# Patient Record
Sex: Female | Born: 1960
Health system: Southern US, Community
[De-identification: ages and names within clinical notes are randomized; demographics above are authoritative.]

## PROBLEM LIST (undated history)

## (undated) DIAGNOSIS — D649 Anemia, unspecified: Secondary | ICD-10-CM

## (undated) DIAGNOSIS — Z8719 Personal history of other diseases of the digestive system: Secondary | ICD-10-CM

## (undated) DIAGNOSIS — M949 Disorder of cartilage, unspecified: Secondary | ICD-10-CM

## (undated) DIAGNOSIS — M5430 Sciatica, unspecified side: Secondary | ICD-10-CM

## (undated) DIAGNOSIS — M899 Disorder of bone, unspecified: Secondary | ICD-10-CM

## (undated) DIAGNOSIS — I10 Essential (primary) hypertension: Secondary | ICD-10-CM

## (undated) DIAGNOSIS — N951 Menopausal and female climacteric states: Secondary | ICD-10-CM

## (undated) DIAGNOSIS — IMO0002 Reserved for concepts with insufficient information to code with codable children: Secondary | ICD-10-CM

## (undated) DIAGNOSIS — G43709 Chronic migraine without aura, not intractable, without status migrainosus: Secondary | ICD-10-CM

## (undated) DIAGNOSIS — B029 Zoster without complications: Secondary | ICD-10-CM

## (undated) HISTORY — DX: Menopausal and female climacteric states: N95.1

## (undated) HISTORY — DX: Chronic migraine without aura, not intractable, without status migrainosus: G43.709

## (undated) HISTORY — DX: Reserved for concepts with insufficient information to code with codable children: IMO0002

## (undated) HISTORY — DX: Anemia, unspecified: D64.9

## (undated) HISTORY — DX: Sciatica, unspecified side: M54.30

## (undated) HISTORY — DX: Personal history of other diseases of the digestive system: Z87.19

## (undated) HISTORY — DX: Zoster without complications: B02.9

## (undated) HISTORY — DX: Disorder of bone, unspecified: M89.9

## (undated) HISTORY — DX: Essential (primary) hypertension: I10

## (undated) HISTORY — DX: Disorder of cartilage, unspecified: M94.9

---

## 1995-10-05 DIAGNOSIS — B029 Zoster without complications: Secondary | ICD-10-CM

## 1995-10-05 HISTORY — DX: Zoster without complications: B02.9

## 2002-02-13 ENCOUNTER — Other Ambulatory Visit: Admission: RE | Admit: 2002-02-13 | Discharge: 2002-02-13 | Payer: Self-pay | Admitting: Internal Medicine

## 2003-12-30 ENCOUNTER — Other Ambulatory Visit: Admission: RE | Admit: 2003-12-30 | Discharge: 2003-12-30 | Payer: Self-pay | Admitting: Obstetrics and Gynecology

## 2004-10-04 HISTORY — PX: ABDOMINAL HYSTERECTOMY: SHX81

## 2004-10-04 HISTORY — PX: OTHER SURGICAL HISTORY: SHX169

## 2004-10-04 LAB — HM MAMMOGRAPHY: HM Mammogram: NORMAL

## 2005-01-20 ENCOUNTER — Inpatient Hospital Stay (HOSPITAL_COMMUNITY): Admission: RE | Admit: 2005-01-20 | Discharge: 2005-01-22 | Payer: Self-pay | Admitting: Obstetrics and Gynecology

## 2005-01-20 ENCOUNTER — Encounter (INDEPENDENT_AMBULATORY_CARE_PROVIDER_SITE_OTHER): Payer: Self-pay | Admitting: Specialist

## 2005-06-23 ENCOUNTER — Ambulatory Visit (HOSPITAL_COMMUNITY): Admission: RE | Admit: 2005-06-23 | Discharge: 2005-06-23 | Payer: Self-pay | Admitting: Internal Medicine

## 2005-07-08 ENCOUNTER — Encounter: Admission: RE | Admit: 2005-07-08 | Discharge: 2005-10-06 | Payer: Self-pay | Admitting: Internal Medicine

## 2005-10-15 ENCOUNTER — Encounter: Admission: RE | Admit: 2005-10-15 | Discharge: 2006-01-13 | Payer: Self-pay | Admitting: Internal Medicine

## 2006-09-26 ENCOUNTER — Emergency Department (HOSPITAL_COMMUNITY): Admission: EM | Admit: 2006-09-26 | Discharge: 2006-09-26 | Payer: Self-pay | Admitting: Emergency Medicine

## 2006-09-27 ENCOUNTER — Emergency Department (HOSPITAL_COMMUNITY): Admission: EM | Admit: 2006-09-27 | Discharge: 2006-09-27 | Payer: Self-pay | Admitting: Emergency Medicine

## 2006-10-04 HISTORY — PX: APPENDECTOMY: SHX54

## 2006-10-14 ENCOUNTER — Inpatient Hospital Stay (HOSPITAL_COMMUNITY): Admission: EM | Admit: 2006-10-14 | Discharge: 2006-10-20 | Payer: Self-pay | Admitting: Emergency Medicine

## 2006-10-14 ENCOUNTER — Encounter (INDEPENDENT_AMBULATORY_CARE_PROVIDER_SITE_OTHER): Payer: Self-pay | Admitting: Specialist

## 2006-10-22 ENCOUNTER — Emergency Department (HOSPITAL_COMMUNITY): Admission: EM | Admit: 2006-10-22 | Discharge: 2006-10-22 | Payer: Self-pay | Admitting: Emergency Medicine

## 2008-09-20 ENCOUNTER — Emergency Department (HOSPITAL_COMMUNITY): Admission: EM | Admit: 2008-09-20 | Discharge: 2008-09-20 | Payer: Self-pay | Admitting: Emergency Medicine

## 2008-10-11 ENCOUNTER — Encounter: Admission: RE | Admit: 2008-10-11 | Discharge: 2009-01-09 | Payer: Self-pay | Admitting: Neurosurgery

## 2008-11-12 ENCOUNTER — Encounter: Admission: RE | Admit: 2008-11-12 | Discharge: 2008-11-12 | Payer: Self-pay | Admitting: Neurosurgery

## 2009-01-10 ENCOUNTER — Ambulatory Visit: Payer: Self-pay | Admitting: Internal Medicine

## 2009-01-10 DIAGNOSIS — I10 Essential (primary) hypertension: Secondary | ICD-10-CM | POA: Insufficient documentation

## 2009-01-10 DIAGNOSIS — G43909 Migraine, unspecified, not intractable, without status migrainosus: Secondary | ICD-10-CM | POA: Insufficient documentation

## 2009-01-10 DIAGNOSIS — M8580 Other specified disorders of bone density and structure, unspecified site: Secondary | ICD-10-CM | POA: Insufficient documentation

## 2009-01-10 DIAGNOSIS — D649 Anemia, unspecified: Secondary | ICD-10-CM | POA: Insufficient documentation

## 2009-01-10 DIAGNOSIS — M5430 Sciatica, unspecified side: Secondary | ICD-10-CM | POA: Insufficient documentation

## 2009-01-10 DIAGNOSIS — Z8719 Personal history of other diseases of the digestive system: Secondary | ICD-10-CM | POA: Insufficient documentation

## 2009-01-13 ENCOUNTER — Encounter: Payer: Self-pay | Admitting: Internal Medicine

## 2009-01-13 LAB — CONVERTED CEMR LAB
ALT: 32 units/L (ref 0–35)
AST: 26 units/L (ref 0–37)
BUN: 12 mg/dL (ref 6–23)
Basophils Absolute: 0.1 10*3/uL (ref 0.0–0.1)
Bilirubin Urine: NEGATIVE
Bilirubin, Direct: 0.1 mg/dL (ref 0.0–0.3)
Cholesterol: 224 mg/dL — ABNORMAL HIGH (ref 0–200)
Creatinine, Ser: 0.7 mg/dL (ref 0.4–1.2)
Direct LDL: 151.6 mg/dL
Eosinophils Relative: 1.7 % (ref 0.0–5.0)
GFR calc non Af Amer: 94.11 mL/min (ref >60)
Glucose, Bld: 94 mg/dL (ref 70–99)
Ketones, ur: NEGATIVE mg/dL
Leukocytes, UA: NEGATIVE
Lymphs Abs: 1.8 10*3/uL (ref 0.7–4.0)
Monocytes Absolute: 0.7 10*3/uL (ref 0.1–1.0)
Monocytes Relative: 10 % (ref 3.0–12.0)
Neutrophils Relative %: 63.2 % (ref 43.0–77.0)
Platelets: 235 10*3/uL (ref 150.0–400.0)
RDW: 12.4 % (ref 11.5–14.6)
Specific Gravity, Urine: 1.02 (ref 1.000–1.030)
TSH: 0.7 microintl units/mL (ref 0.35–5.50)
Total Bilirubin: 0.7 mg/dL (ref 0.3–1.2)
Total CHOL/HDL Ratio: 4
Triglycerides: 58 mg/dL (ref 0.0–149.0)
Urine Glucose: NEGATIVE mg/dL
Urobilinogen, UA: 0.2 (ref 0.0–1.0)
VLDL: 11.6 mg/dL (ref 0.0–40.0)
Vit D, 25-Hydroxy: 12 ng/mL — ABNORMAL LOW (ref 30–89)
WBC: 7.2 10*3/uL (ref 4.5–10.5)

## 2009-02-01 HISTORY — PX: OTHER SURGICAL HISTORY: SHX169

## 2009-02-04 ENCOUNTER — Ambulatory Visit (HOSPITAL_COMMUNITY): Admission: RE | Admit: 2009-02-04 | Discharge: 2009-02-05 | Payer: Self-pay | Admitting: Neurosurgery

## 2009-10-08 ENCOUNTER — Telehealth: Payer: Self-pay | Admitting: Internal Medicine

## 2010-03-03 ENCOUNTER — Telehealth: Payer: Self-pay | Admitting: Internal Medicine

## 2010-03-27 ENCOUNTER — Ambulatory Visit: Payer: Self-pay | Admitting: Internal Medicine

## 2010-03-27 DIAGNOSIS — N951 Menopausal and female climacteric states: Secondary | ICD-10-CM | POA: Insufficient documentation

## 2010-03-30 LAB — CONVERTED CEMR LAB
AST: 36 units/L (ref 0–37)
BUN: 10 mg/dL (ref 6–23)
Basophils Absolute: 0 10*3/uL (ref 0.0–0.1)
Bilirubin, Direct: 0.1 mg/dL (ref 0.0–0.3)
Calcium: 8.9 mg/dL (ref 8.4–10.5)
Cholesterol: 189 mg/dL (ref 0–200)
Creatinine, Ser: 0.6 mg/dL (ref 0.4–1.2)
GFR calc non Af Amer: 114.09 mL/min (ref >60)
Glucose, Bld: 113 mg/dL — ABNORMAL HIGH (ref 70–99)
HCT: 37.4 % (ref 36.0–46.0)
HDL: 44.6 mg/dL (ref >39.00)
Ketones, ur: NEGATIVE mg/dL
Leukocytes, UA: NEGATIVE
Lymphs Abs: 2 10*3/uL (ref 0.7–4.0)
Monocytes Relative: 7.2 % (ref 3.0–12.0)
Neutrophils Relative %: 68.7 % (ref 43.0–77.0)
Nitrite: NEGATIVE
Platelets: 246 10*3/uL (ref 150.0–400.0)
Potassium: 3.8 meq/L (ref 3.5–5.1)
RDW: 12.7 % (ref 11.5–14.6)
Specific Gravity, Urine: 1.02 (ref 1.000–1.030)
TSH: 0.68 microintl units/mL (ref 0.35–5.50)
Total Bilirubin: 0.5 mg/dL (ref 0.3–1.2)
Total Protein, Urine: NEGATIVE mg/dL
Triglycerides: 210 mg/dL — ABNORMAL HIGH (ref 0.0–149.0)
VLDL: 42 mg/dL — ABNORMAL HIGH (ref 0.0–40.0)
WBC: 9 10*3/uL (ref 4.5–10.5)
pH: 6 (ref 5.0–8.0)

## 2010-07-21 ENCOUNTER — Emergency Department (HOSPITAL_COMMUNITY): Admission: EM | Admit: 2010-07-21 | Discharge: 2010-07-21 | Payer: Self-pay | Admitting: Emergency Medicine

## 2010-08-03 ENCOUNTER — Encounter (INDEPENDENT_AMBULATORY_CARE_PROVIDER_SITE_OTHER): Payer: Self-pay | Admitting: *Deleted

## 2010-08-05 ENCOUNTER — Encounter
Admission: RE | Admit: 2010-08-05 | Discharge: 2010-09-10 | Payer: Self-pay | Source: Home / Self Care | Attending: Neurosurgery | Admitting: Neurosurgery

## 2010-09-03 HISTORY — PX: LUMBAR FUSION: SHX111

## 2010-09-11 ENCOUNTER — Inpatient Hospital Stay (HOSPITAL_COMMUNITY)
Admission: RE | Admit: 2010-09-11 | Discharge: 2010-09-14 | Payer: Self-pay | Source: Home / Self Care | Attending: Neurosurgery | Admitting: Neurosurgery

## 2010-11-03 NOTE — Progress Notes (Signed)
Summary: Rx refill req  Phone Note Call from Patient Call back at Home Phone [phone]   Caller: Patient [phone] Summary of Call: pt called requesting 1 month supply of Lopressor until next appt with VAL, 06/24. Rx done, pt informed Initial call taken by: Margaret Pyle, CMA,  Mar 03, 2010 11:06 AM    Prescriptions: LOPRESSOR 50 MG TABS (METOPROLOL TARTRATE) take 1/2 by mouth two times a day  #30.0 Each x 0   Entered by:   Margaret Pyle, CMA   Authorized by:   Newt Lukes MD   Signed by:   Margaret Pyle, CMA on 03/03/2010   Method used:   Electronically to        [facility] [address] [phone]* (retail)       [address]       [address]       [place], [place]  [address]       Ph: [phone]       Fax: [phone]   RxID:   [id number]

## 2010-11-03 NOTE — Letter (Signed)
Summary: [facility] Referral (unable to schedule) Notification  [facility]  [address] [place], [place] [address]   Phone: [phone]  Fax: [phone]      August 03, 2010 [name] 1961-06-12 MRN: [id number]   [name] [address] [place], [place]  [address]   Dear Dr. Felicity Coyer:   Thank you for your kind referral of the above patient. We have attempted to schedule the recommended Colonoscopy but have been unable to schedule because:  _x_ The patient was not available by phone and/or has not returned our calls.  __ The patient declined to schedule the procedure at this time.  We appreciate the referral and hope that we will have the opportunity to treat this patient in the future.    Sincerely,   [facility]  Vania Rea. Jarold Motto M.D. Hedwig Morton. Juanda Chance M.D. Venita Lick. Russella Dar M.D. Wilhemina Bonito. Marina Goodell M.D. Barbette Hair. Arlyce Dice M.D. Iva Boop M.D. Cheron Every.D.

## 2010-11-03 NOTE — Progress Notes (Signed)
Summary: rx request  Phone Note Call from Patient Call back at Home Phone [phone]   Summary of Call: Spoke with patient and she needs her prescription's for Lopressor, HCTZ, and Maxalt sent to [facility] on [address] (due to new insurance). Ok to proceed? Initial call taken by: Lucious Groves,  October 08, 2009 10:19 AM  Follow-up for Phone Call        yes - ok to do so - thanks Follow-up by: Newt Lukes MD,  October 08, 2009 11:56 AM  Additional Follow-up for Phone Call Additional follow up Details #1::        Please advise on quantity of Maxalt, thanks Additional Follow-up by: Margaret Pyle, CMA,  October 08, 2009 12:12 PM    Additional Follow-up for Phone Call Additional follow up Details #2::    1 box (contains 12 doses), refill 1 Newt Lukes MD  October 08, 2009 12:48 PM   Prescriptions: MAXALT 10 MG TABS (RIZATRIPTAN BENZOATE) take as needed for migraine  #1 box x 1   Entered by:   Margaret Pyle, CMA   Authorized by:   Newt Lukes MD   Signed by:   Margaret Pyle, CMA on 10/08/2009   Method used:   Electronically to        [facility] [address] [phone]* (retail)       [address]       [address]       [place], [place]  [address]       Ph: [phone]       Fax: [phone]   RxID:   [id number] LOPRESSOR 50 MG TABS (METOPROLOL TARTRATE) take 1/2 by mouth two times a day  #30 x 2   Entered by:   Margaret Pyle, CMA   Authorized by:   Newt Lukes MD   Signed by:   Margaret Pyle, CMA on 10/08/2009   Method used:   Electronically to        [facility] [address] [phone]* (retail)       [address]       [address]       [place], [place]  [address]       Ph: [phone]       Fax: [phone]   RxID:   [id number] HYDROCHLOROTHIAZIDE 12.5 MG TABS (HYDROCHLOROTHIAZIDE) take 1 by mouth qd  #30 x 4   Entered by:   Margaret Pyle, CMA   Authorized by:   Newt Lukes MD   Signed by:   Margaret Pyle, CMA on 10/08/2009   Method used:   Electronically to        [facility] [address] [phone]* (retail)       [address]       [address]       [place], [place]  [address]       Ph: [phone]       Fax: [phone]   RxID:   [id number]

## 2010-11-03 NOTE — Assessment & Plan Note (Signed)
Summary: FU---BP MED---STC   Vital Signs:  Patient profile:   50 year old female Height:      5.3 inches (13.46 cm) Weight:      133.12 pounds (60.51 kg) BMI:     3343.96 O2 Sat:      97 % on Room air Temp:     98.0 degrees F (36.67 degrees C) oral Pulse rate:   82 / minute BP sitting:   120 / 78  (left arm) Cuff size:   regular  Vitals Entered By: Orlan Leavens (March 27, 2010 1:06 PM)  O2 Flow:  Room air CC: F/u on BP MEDS Comments Requesting 90 day on meds send to [facility]   Primary Care Provider:  Newt Lukes MD  CC:  F/u on BP MEDS.  History of Present Illness: patient is here today for annual physical. Patient feels well and has no complaints.   Preventive Screening-Counseling & Management  Alcohol-Tobacco     Alcohol drinks/day: <1     Smoking Status: never     Tobacco Counseling: not indicated; no tobacco use  Caffeine-Diet-Exercise     Exercise Counseling: to improve exercise regimen     Depression Counseling: not indicated; screening negative for depression  Safety-Violence-Falls     Seat Belt Counseling: not indicated; patient wears seat belts     Helmet Counseling: not indicated; patient wears helmet when riding bicycle/motocycle     Firearms in the Home: no firearms in the home     Firearm Counseling: not applicable     Violence in the Home: no risk noted     Fall Risk Counseling: not indicated; no significant falls noted  Clinical Review Panels:  Prevention   Last Mammogram:  normal (10/04/2004)   Last Pap Smear:  normal (10/04/2004)  Immunizations   Last Tetanus Booster:  Historical (10/04/2001)  Lipid Management   Cholesterol:  224 (01/10/2009)   HDL (good cholesterol):  52.30 (01/10/2009)  CBC   WBC:  7.2 (01/10/2009)   RBC:  4.32 (01/10/2009)   Hgb:  14.0 (01/10/2009)   Hct:  40.7 (01/10/2009)   Platelets:  235.0 (01/10/2009)   MCV  94.3 (01/10/2009)   MCHC  34.4 (01/10/2009)   RDW  12.4 (01/10/2009)   PMN:  63.2  (01/10/2009)   Lymphs:  24.4 (01/10/2009)   Monos:  10.0 (01/10/2009)   Eosinophils:  1.7 (01/10/2009)   Basophil:  0.7 (01/10/2009)  Complete Metabolic Panel   Glucose:  94 (01/10/2009)   Sodium:  142 (01/10/2009)   Potassium:  4.3 (01/10/2009)   Chloride:  107 (01/10/2009)   CO2:  30 (01/10/2009)   BUN:  12 (01/10/2009)   Creatinine:  0.7 (01/10/2009)   Albumin:  3.8 (01/10/2009)   Total Protein:  7.0 (01/10/2009)   Calcium:  9.1 (01/10/2009)   Total Bili:  0.7 (01/10/2009)   Alk Phos:  68 (01/10/2009)   SGPT (ALT):  32 (01/10/2009)   SGOT (AST):  26 (01/10/2009)   Current Medications (verified): 1)  Hydrochlorothiazide 12.5 Mg Tabs (Hydrochlorothiazide) .... Take 1 By Mouth Qd 2)  Lopressor 50 Mg Tabs (Metoprolol Tartrate) .... Take 1/2 By Mouth Two Times A Day 3)  Maxalt 10 Mg Tabs (Rizatriptan Benzoate) .... Take As Needed For Migraine 4)  Ibuprofen 600 Mg Tabs (Ibuprofen) .... Take 1 Q 6 Hours Prn 5)  Vitamin D3 1000 Unit Tabs (Cholecalciferol) .Marland Kitchen.. 1 By Mouth Once Daily 6)  Excedrin Migraine 250-250-65 Mg Tabs (Aspirin-Acetaminophen-Caffeine) .... Use As Needed  Allergies (verified):  No Known Drug Allergies  Past History:  Past Medical History: Anemia-NOS Diverticulitis, hx of Hypertension Osteopenia migraines  MD roster: nsurg - roy  Past Surgical History: Appendectomy (2007) Hysterectomy (2006) Bladder Tact (2006) right L5-S1 laminectomy (02/2009)  Family History: Reviewed history from 01/10/2009 and no changes required. Family History of Arthritis (grandparents) Family History Diabetes 1st degree relative (grandparents) Family History High cholesterol (parents) Family hx hypothyroid (parents & grandparent) Family History of CAD Female 1st degree relative <60 (parent) Family History Hypertension (parents)  Social History: Marital Status: Married Children:  Occupation: Teacher, early years/pre, Associate Professor  Review of Systems       see HPI  above. I have reviewed all other systems and they were negative.   Physical Exam  General:  alert, well-developed, well-nourished, and cooperative to examination.    Eyes:  vision grossly intact; pupils equal, round and reactive to light.  conjunctiva and lids normal.    Ears:  normal pinnae bilaterally, without erythema, swelling, or tenderness to palpation. TMs clear, without effusion, or cerumen impaction. Hearing grossly normal bilaterally  Mouth:  teeth and gums in good repair; mucous membranes moist, without lesions or ulcers. oropharynx clear without exudate, no erythema.  Lungs:  normal respiratory effort, no intercostal retractions or use of accessory muscles; normal breath sounds bilaterally - no crackles and no wheezes.    Heart:  normal rate, regular rhythm, no murmur, and no rub. BLE without edema. normal DP pulses and normal cap refill in all 4 extremities    Abdomen:  soft, non-tender, normal bowel sounds, no distention; no masses and no appreciable hepatomegaly or splenomegaly.   Msk:  No deformity or scoliosis noted of thoracic or lumbar spine.   Neurologic:  alert & oriented X3 and cranial nerves II-XII symetrically intact.  strength normal in all extremities, sensation intact to light touch, and gait normal. speech fluent without dysarthria or aphasia; follows commands with good comprehension.  Skin:  no rashes, vesicles, ulcers, or erythema. No nodules or irregularity to palpation.  Psych:  Oriented X3, memory intact for recent and remote, normally interactive, good eye contact, not anxious appearing, not depressed appearing, and not agitated.      Impression & Recommendations:  Problem # 1:  PREVENTIVE HEALTH CARE (ICD-V70.0)  Patient has been counseled on age-appropriate routine health concerns for screening and prevention. These are reviewed and up-to-date. Immunizations are up-to-date or declined. Labs today and ECG reviewed. refer for mammo and colo  now  Orders: TLB-Lipid Panel (80061-LIPID) TLB-BMP (Basic Metabolic Panel-BMET) (80048-METABOL) TLB-CBC Platelet - w/Differential (85025-CBCD) TLB-Hepatic/Liver Function Pnl (80076-HEPATIC) TLB-TSH (Thyroid Stimulating Hormone) (84443-TSH) TLB-Udip w/ Micro (81001-URINE) T-Vitamin D (25-Hydroxy) (62831-51761) EKG w/ Interpretation (93000) Gastroenterology Referral (GI) Misc. Referral (Misc. Ref)  Problem # 2:  MENOPAUSAL SYNDROME (ICD-627.2) s/p hysterectomy so no menses to judge - check FSH Orders: TLB-FSH (Follicle Stimulating Hormone) (83001-FSH)  Problem # 3:  OSTEOPENIA (ICD-733.90)  Orders: T-Vitamin D (25-Hydroxy) (60737-10626)  Discussed medication use, applications of heat or ice, and exercises.   Problem # 4:  SCIATICA, LEFT (ICD-724.3) resolved s/p decompression 02/2009 - surg note reviewed The following medications were removed from the medication list:    Ultram 50 Mg Tabs (Tramadol hcl) .Marland Kitchen... Take 1-2 q 6 hours prn    Robaxin 500 Mg Tabs (Methocarbamol) .Marland Kitchen... Take 1 q 4-6 hours prn    Hydrocodone-acetaminophen 5-325 Mg Tabs (Hydrocodone-acetaminophen) .Marland Kitchen... Take as needed for back pain Her updated medication list for this problem includes:  Ibuprofen 600 Mg Tabs (Ibuprofen) .Marland Kitchen... Take 1 q 6 hours prn    Excedrin Migraine 250-250-65 Mg Tabs (Aspirin-acetaminophen-caffeine) ..... Use as needed  Complete Medication List: 1)  Hydrochlorothiazide 12.5 Mg Tabs (Hydrochlorothiazide) .... Take 1 by mouth qd 2)  Maxalt 10 Mg Tabs (Rizatriptan benzoate) .... Take as needed for migraine 3)  Ibuprofen 600 Mg Tabs (Ibuprofen) .... Take 1 q 6 hours prn 4)  Vitamin D3 1000 Unit Tabs (Cholecalciferol) .Marland Kitchen.. 1 by mouth once daily 5)  Excedrin Migraine 250-250-65 Mg Tabs (Aspirin-acetaminophen-caffeine) .... Use as needed 6)  Metoprolol Tartrate 25 Mg Tabs (Metoprolol tartrate) .... Take 1 by mouth two times a day  Patient Instructions: 1)  it was good to see you today. 2)   exam and EKG look great -  3)  test(s) ordered today - your results will be posted on the phone tree for review in 48-72 hours from the time of test completion; call [phone] and enter your 9 digit MRN (listed above on this page, just below your name); if any changes need to be made or there are abnormal results, you will be contacted directly. 4)  we'll make referral for mammogram at [facility] and screening colonoscopy. Our office will contact you regarding this appointment once made.  5)  90 day supply on meds as requested - no changes in dose - 6)  Please schedule a follow-up appointment annually for physical, call sooner if problems.  Prescriptions: METOPROLOL TARTRATE 25 MG TABS (METOPROLOL TARTRATE) take 1 by mouth two times a day  #180 x 2   Entered by:   Orlan Leavens   Authorized by:   Newt Lukes MD   Signed by:   Newt Lukes MD on 03/27/2010   Method used:   Electronically to        [facility] [address] [phone]* (retail)       [address]       [address]       [place], [place]  [address]       Ph: [phone]       Fax: [phone]   RxID:   [id number] HYDROCHLOROTHIAZIDE 12.5 MG TABS (HYDROCHLOROTHIAZIDE) take 1 by mouth qd  #90 x 2   Entered by:   Orlan Leavens   Authorized by:   Newt Lukes MD   Signed by:   Orlan Leavens on 03/27/2010   Method used:   Electronically to        [facility] [address] [phone]* (retail)       [address]       [address]       [place], [place]  [address]       Ph: [phone]       Fax: [phone]   RxID:   [id number]

## 2010-12-10 ENCOUNTER — Ambulatory Visit: Payer: BC Managed Care – PPO | Attending: Neurosurgery | Admitting: Physical Therapy

## 2010-12-10 DIAGNOSIS — M6281 Muscle weakness (generalized): Secondary | ICD-10-CM | POA: Insufficient documentation

## 2010-12-10 DIAGNOSIS — R293 Abnormal posture: Secondary | ICD-10-CM | POA: Insufficient documentation

## 2010-12-10 DIAGNOSIS — IMO0001 Reserved for inherently not codable concepts without codable children: Secondary | ICD-10-CM | POA: Insufficient documentation

## 2010-12-14 ENCOUNTER — Ambulatory Visit: Payer: BC Managed Care – PPO | Admitting: Rehabilitation

## 2010-12-14 LAB — DIFFERENTIAL
Eosinophils Absolute: 0.4 10*3/uL (ref 0.0–0.7)
Lymphocytes Relative: 35 % (ref 12–46)
Lymphs Abs: 2.3 10*3/uL (ref 0.7–4.0)
Neutrophils Relative %: 48 % (ref 43–77)

## 2010-12-14 LAB — COMPREHENSIVE METABOLIC PANEL
ALT: 51 U/L — ABNORMAL HIGH (ref 0–35)
BUN: 12 mg/dL (ref 6–23)
CO2: 29 mEq/L (ref 19–32)
Calcium: 9.5 mg/dL (ref 8.4–10.5)
Creatinine, Ser: 0.68 mg/dL (ref 0.4–1.2)
GFR calc non Af Amer: >60 mL/min (ref >60)
Glucose, Bld: 94 mg/dL (ref 70–99)

## 2010-12-14 LAB — TYPE AND SCREEN: Antibody Screen: NEGATIVE

## 2010-12-14 LAB — PROTIME-INR
INR: 0.95 (ref 0.00–1.49)
Prothrombin Time: 12.9 seconds (ref 11.6–15.2)

## 2010-12-14 LAB — CBC
HCT: 40.3 % (ref 36.0–46.0)
Hemoglobin: 13.7 g/dL (ref 12.0–15.0)
MCH: 31 pg (ref 26.0–34.0)
MCHC: 34 g/dL (ref 30.0–36.0)

## 2010-12-14 LAB — SURGICAL PCR SCREEN: MRSA, PCR: NEGATIVE

## 2010-12-28 ENCOUNTER — Ambulatory Visit: Payer: BC Managed Care – PPO | Admitting: Physical Therapy

## 2010-12-30 ENCOUNTER — Encounter: Payer: BC Managed Care – PPO | Admitting: Physical Therapy

## 2011-01-04 ENCOUNTER — Encounter: Payer: BC Managed Care – PPO | Admitting: Physical Therapy

## 2011-01-05 ENCOUNTER — Other Ambulatory Visit: Payer: Self-pay | Admitting: Internal Medicine

## 2011-01-06 ENCOUNTER — Encounter: Payer: BC Managed Care – PPO | Admitting: Physical Therapy

## 2011-01-13 LAB — COMPREHENSIVE METABOLIC PANEL
BUN: 12 mg/dL (ref 6–23)
CO2: 28 mEq/L (ref 19–32)
Chloride: 101 mEq/L (ref 96–112)
Creatinine, Ser: 0.69 mg/dL (ref 0.4–1.2)
GFR calc non Af Amer: >60 mL/min (ref >60)
Glucose, Bld: 112 mg/dL — ABNORMAL HIGH (ref 70–99)
Total Bilirubin: 0.7 mg/dL (ref 0.3–1.2)

## 2011-01-13 LAB — URINALYSIS, ROUTINE W REFLEX MICROSCOPIC
Glucose, UA: NEGATIVE mg/dL
Leukocytes, UA: NEGATIVE
pH: 6 (ref 5.0–8.0)

## 2011-01-13 LAB — CBC
HCT: 40.1 % (ref 36.0–46.0)
Hemoglobin: 14.1 g/dL (ref 12.0–15.0)
MCV: 92.7 fL (ref 78.0–100.0)
Platelets: 330 10*3/uL (ref 150–400)
RBC: 4.32 MIL/uL (ref 3.87–5.11)
WBC: 10.4 10*3/uL (ref 4.0–10.5)

## 2011-01-13 LAB — DIFFERENTIAL
Basophils Absolute: 0 10*3/uL (ref 0.0–0.1)
Basophils Relative: 0 % (ref 0–1)
Lymphocytes Relative: 16 % (ref 12–46)
Neutro Abs: 8.1 10*3/uL — ABNORMAL HIGH (ref 1.7–7.7)
Neutrophils Relative %: 78 % — ABNORMAL HIGH (ref 43–77)

## 2011-01-13 LAB — URINE MICROSCOPIC-ADD ON

## 2011-01-13 LAB — PROTIME-INR: Prothrombin Time: 12.6 seconds (ref 11.6–15.2)

## 2011-01-13 LAB — APTT: aPTT: 26 seconds (ref 24–37)

## 2011-02-16 NOTE — Op Note (Signed)
[name], DARRAH               ACCOUNT NO.:  [id number]   MEDICAL RECORD NO.:  [id number]          PATIENT TYPE:  OIB   LOCATION:  3005                         FACILITY:  [facility]   PHYSICIAN:  Payton Doughty, M.D.      DATE OF BIRTH:  1961/01/22   DATE OF PROCEDURE:  02/04/2009  DATE OF DISCHARGE:                               OPERATIVE REPORT   PREOPERATIVE DIAGNOSIS:  Herniated disk at L5-S1 on the right.   POSTOPERATIVE DIAGNOSIS:  Herniated disk at L5-S1 on the right.   PROCEDURE:  Right L5-S1 laminectomy and diskectomy.   SURGEON:  Payton Doughty, MD   ANESTHESIA:  General endotracheal.   PREPARATION:  Prepped and draped with alcohol wipe.   COMPLICATIONS:  None.   NURSE ASSISTANT:  Bedelia Person, MD   BODY OF TEXT:  This is a 50 year old girl with herniated disk at L5-S1  on the right.  She was taken to operating room, smoothly anesthetized,  intubated, and placed prone on the operating table.  Following shave,  prep, and drape in the usual sterile fashion, skin was infiltrated with  1% lidocaine with 1:400,000 epinephrine.  The skin was incised over the  L5 lamina that was dissected free in the subperiosteal plain.  Intraoperative x-ray confirmed correctness level.  After confirming  correctness level, hemi-semi-laminectomy was carried out to the top of  ligamentum flavum that was removed in retrograde fashion.  S1 was  undercut allowing full access to the right S1 root.  This was gently  dissected free and retracted toward the midline and underneath a large  herniated disk was found.  The remaining annular fibers were divided and  a disk fragment removed.  This resulted in immediate decompression of  the right S1 root.  The disk space was explored to remove all marginally  clean fragments.  The nerve root was explored as well was the anterior  epidural space and found to be free of compressive pathology.  Following  complete removal of the all pending fragments, the wound  was irrigated  and hemostasis assured.  Depo-Medrol soaked fat was placed in  laminectomy defect.  Successive layers of 0-Vicryl, 2-0 Vicryl, and 4-0  Vicryl were used to close.  Benzoin and Steri-Strips were placed and  made occlusive with Telfa and OpSite, and the patient returned to the  recovery room in good condition.           ______________________________  Payton Doughty, M.D.     MWR/MEDQ  D:  02/04/2009  T:  02/05/2009  Job:  [id number]

## 2011-02-16 NOTE — H&P (Signed)
NAME:  MARCELL, PFEIFER NO.:  [id number]   MEDICAL RECORD NO.:  [id number]           PATIENT TYPE:   LOCATION:                                 FACILITY:   PHYSICIAN:  Payton Doughty, M.D.           DATE OF BIRTH:   DATE OF ADMISSION:  02/04/2009  DATE OF DISCHARGE:                              HISTORY & PHYSICAL   Feb 04, 2009   ADMISSION DIAGNOSIS:  Herniated disk on the right side at L5-S1.   BODY OF TEXT:  This is a now 50 year old right-handed white girl who I  saw in 2007.  She had pain in her back in her right buttock.  MR showed  a disk at L5-S1, did a lot with epidural steroids.  Several weeks ago,  she had a marked increase in pain in her back and down her right leg,  got a couple of epidural steroids, was not helpful, had a disk that  showed more disk material with compression of the right S1 root.  She  has a lot of pain and is now admitted for a right L5-S1 diskectomy.  Medical history is very benign.  She uses Robaxin and ibuprofen p.r.n.   ALLERGIES:  None.   PAST SURGICAL HISTORY:  Hysterectomy and bladder tack in 2006.   SOCIAL HISTORY:  Does not smoke, drinks on a very limited social basis,  and is a Teacher, early years/pre at [employer].   FAMILY HISTORY:  Mother is 68 in excellent health with osteoporosis.  Father is 2, has hypertension, heart failure, and has had a bypass.   REVIEW OF SYSTEMS:  Remarkable for wearing glasses, back pain, and leg  pain.   PHYSICAL EXAMINATION:  HEENT:  Within normal limits.  NECK:  She has good range of motion of the neck.  CHEST:  Clear.  CARDIAC:  1/6 systolic murmur.  ABDOMEN:  Nontender with no hepatosplenomegaly.  EXTREMITIES:  No clubbing or cyanosis.  GU:  Deferred.  Peripheral pulses are good.  NEUROLOGIC:  She is awake, alert, and oriented.  Cranial nerves are  intact.  Motor exam shows 5/5 strength throughout the upper and lower  extremities.  Dysesthesias described in right S1 distribution.  Reflexes  are 2 at  the knees, absent at the right ankle, and 1 at the left.  Straight leg and reverse straight leg raise are both positive for right  leg pain.   LABORATORY DATA:  MR demonstrates a herniated disk at L5-S1 off to the  right with elevation of the right S1 root.   CLINICAL IMPRESSION:  Right S1 radiculopathy secondary to herniated disk  at L5-S1.  The plan is for right L5-S1 diskectomy.  The risks and  benefits have been discussed with her and she wishes to proceed.           ______________________________  Payton Doughty, M.D.     MWR/MEDQ  D:  02/04/2009  T:  02/04/2009  Job:  [id number]

## 2011-02-19 NOTE — Discharge Summary (Signed)
Brittany, [name]               ACCOUNT NO.:  [id number]   MEDICAL RECORD NO.:  [id number]          PATIENT TYPE:  INP   LOCATION:  5713                         FACILITY:  [facility]   PHYSICIAN:  Anselm Pancoast. Weatherly, M.D.DATE OF BIRTH:  22-Sep-1961   DATE OF ADMISSION:  10/14/2006  DATE OF DISCHARGE:  10/20/2006                               DISCHARGE SUMMARY   CHIEF COMPLAINT/REASON FOR ADMISSION:  Ms. [name] is a 50 year old  female on chronic pain medications due to L5-S1 disk herniation. Had  been having right lower quadrant pain for three days, but felt this was  similar to usual neuropathic pain symptoms, but unfortunately pain  became quite severe. She finally presented to the ER. White count was  elevated at 15,800, neutrophils 91%. She was having significant right  lower quadrant abdominal pain with involuntary guarding and rebounding.  A CT scan of the abdomen and pelvis was performed, and this revealed a  perforated appendix with an appendicolith and possible abscess  formations in the lower pelvis. Surgical evaluation was requested. We  examined the patient on the date of admission. She was found to have an  acute abdomen. Her fever was 102.2. She was tachycardiac. Her abdomen  was soft and distended. No bowel sounds were present. She was focally  tender in the right lower quadrant with involuntary guarding and  rebounding. The patient was admitted with the following diagnoses.   DIAGNOSES:  1. Acute appendicitis with perforation and suspected abscess      formation.  2. Leukocytosis.  3. Hypertension.  4. Chronic pain in patient with known L5-S1 disk herniation.   HOSPITAL COURSE:  The patient was admitted from the ER. Taken directly  to the OR by Dr. Lindie Spruce where she  underwent open appendectomy. Prior to  surgery the patient received Primaxin IV stat for empiric antibiotic  coverage. The patient did undergo an open appendectomy and tolerated the  procedure well  and was sent back to the surgical floor to recover.   The postoperative period the patient was stable. She did have a mild  postoperative ileus, but was tolerating full liquids by postop day #2.  She was continued on Primaxin in the postoperative period. In the  intraoperative period JP drain had been placed, and output remained  serosanguineous. By postop day #4 her white count had decreased to 5700,  hemoglobin was stable 11.9, potassium 4.4, creatinine 0.49. She was  having a large amount of serous out of the JP though at 475 cc. At this  point since she was tolerating a normal diet, she was resumed on her  usual chronic pain med regimen. Her pathology was returned without any  malignancy noted. By postop day #5 she was tolerating a regular diet.  Abdomen was soft. By postop day #6 her wound was stable. Dr. Zachery Dakins  evaluated the patient. Her staples were removed. Her JP drain was  removed. Her white count was 6000, and she was deemed appropriate for  discharge home.   FINAL DISCHARGE DIAGNOSES:  1. Acute perforated appendix with abscess.  2. Status post open appendectomy.  3. Chronic pain due to disk herniation on chronic pain medications.  4. Hypertension.   DISCHARGE MEDICATIONS:  1. The patient is to resume her usual pain medications.  2. Protonix 40 mg daily.   DIET:  No restrictions.   ACTIVITY:  May shower.   FOLLOW UP:  The patient needs to call Dr. Dixon Boos office to be seen in  one week.      Allison L. Rennis Harding, N.P.    ______________________________  Anselm Pancoast. Zachery Dakins, M.D.    ALE/MEDQ  D:  12/09/2006  T:  12/09/2006  Job:  [id number]

## 2011-03-22 IMAGING — CR DG CHEST 2V
2 series · 2 of 2 positions shown · non-contrast
Comparison: Chest x-ray of 01/31/2009

CLINICAL DATA: Preop for lumbar spine surgery

CHEST - 2 VIEW

[view not recorded (1 of 2)]
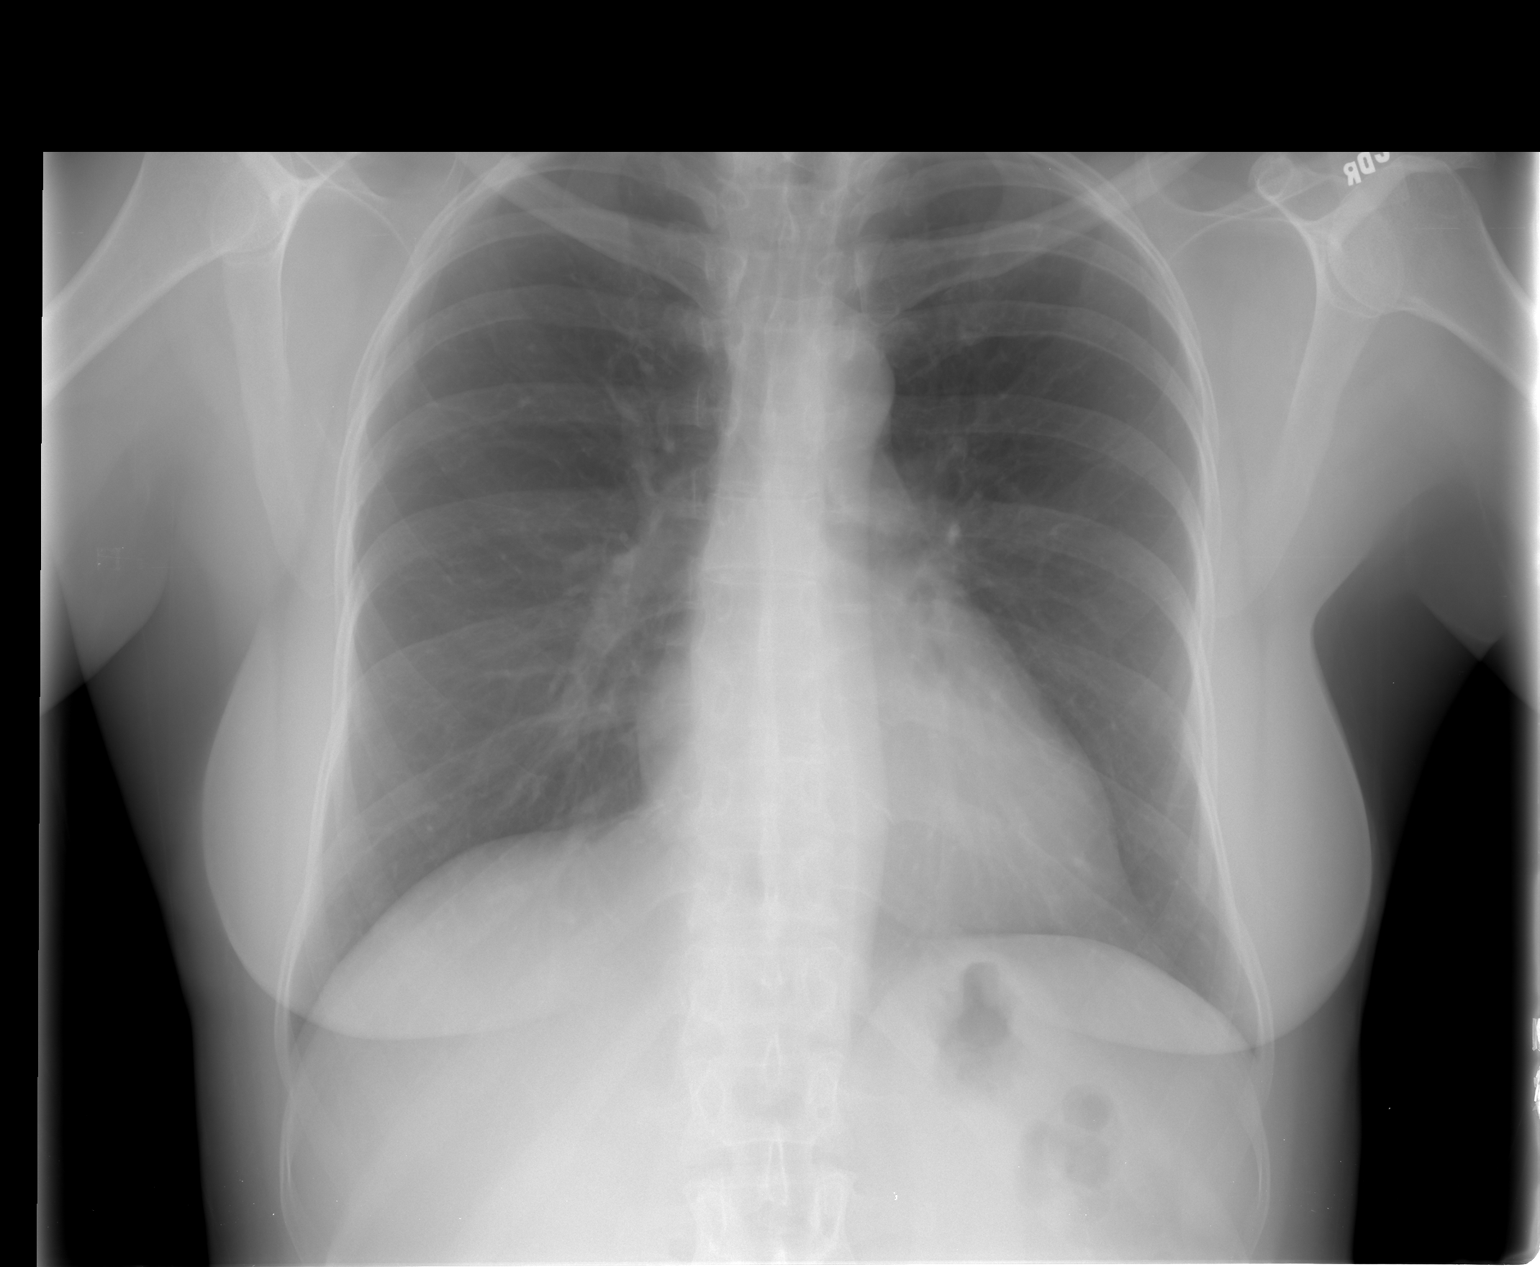

[view not recorded (2 of 2)]
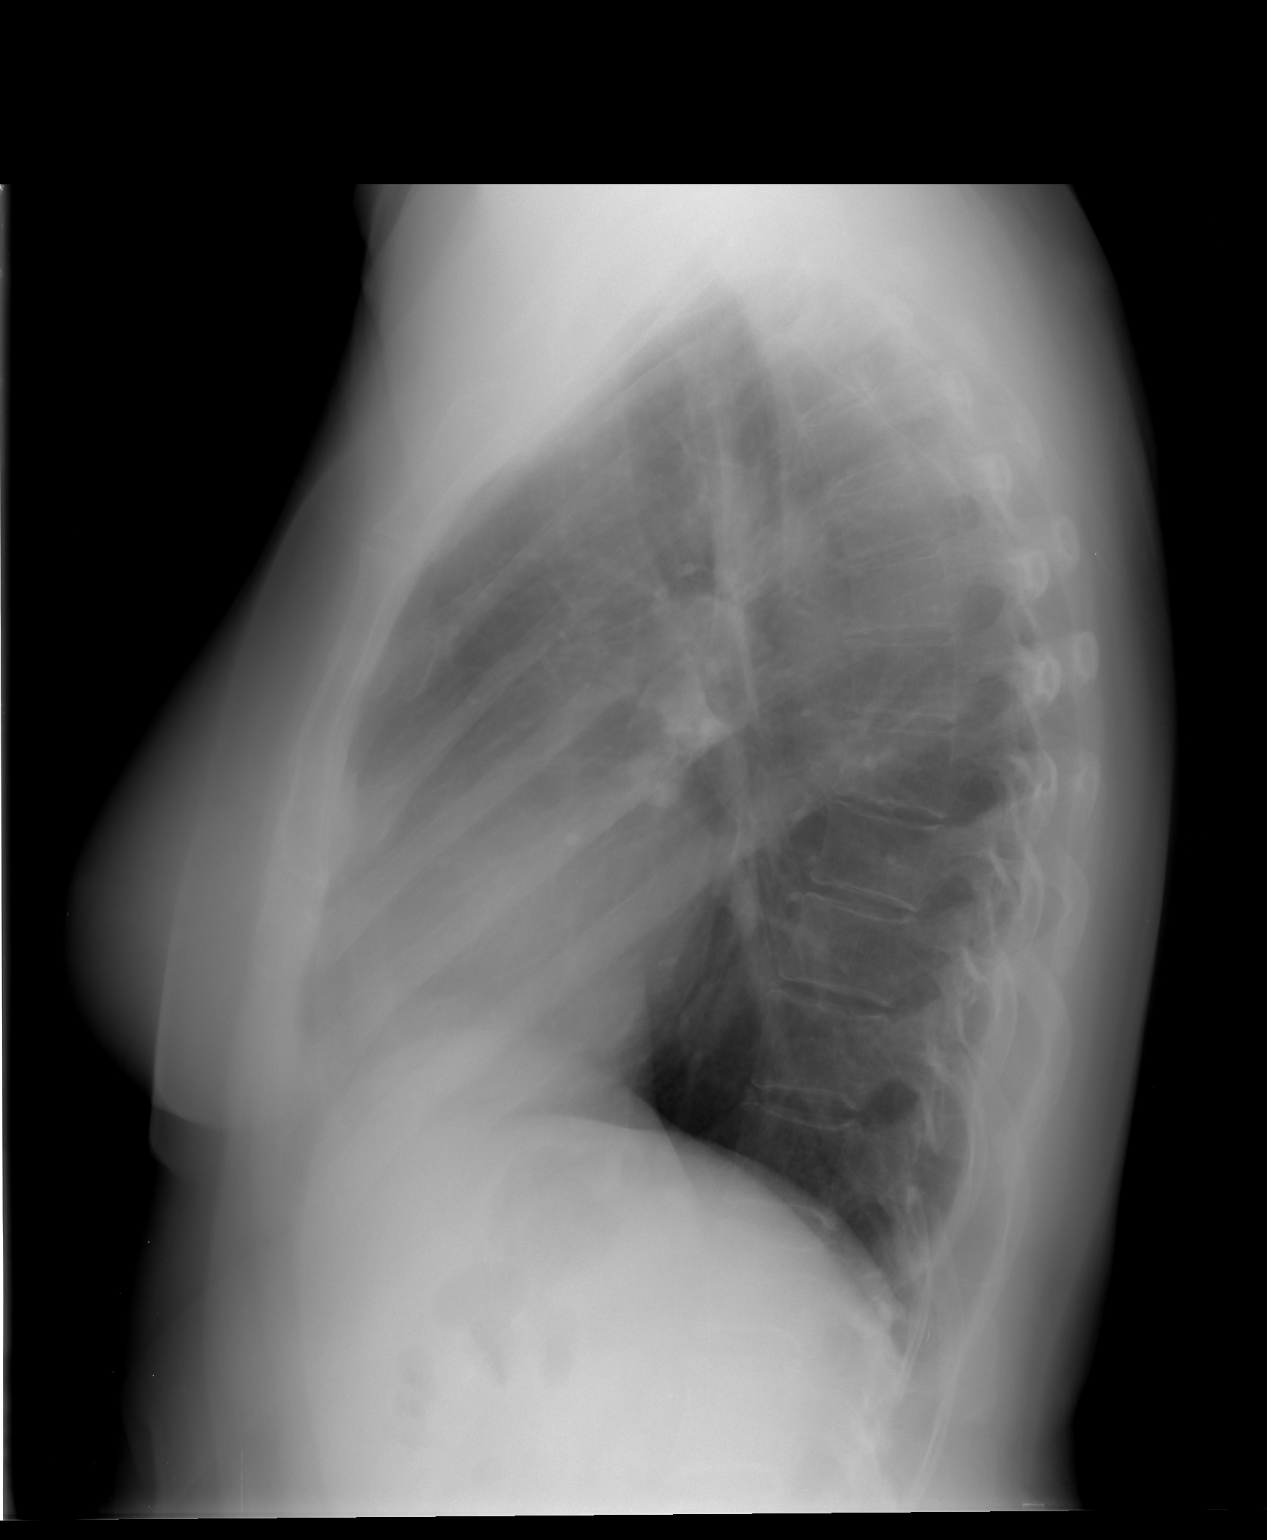

[2 of 2 positions shown; findings below may reference images not displayed]

FINDINGS: The lungs are clear.  Mediastinal contours appear normal.
The heart is within normal limits in size.  No bony abnormality is
seen.
IMPRESSION: No active lung disease.

## 2011-03-29 ENCOUNTER — Other Ambulatory Visit: Payer: Self-pay | Admitting: *Deleted

## 2011-03-29 MED ORDER — RIZATRIPTAN BENZOATE 10 MG PO TABS: 10.0000 mg | ORAL_TABLET | ORAL | Status: DC | PRN

## 2011-03-29 MED ORDER — HYDROCHLOROTHIAZIDE 12.5 MG PO CAPS: 12.5000 mg | ORAL_CAPSULE | ORAL | Status: DC

## 2011-03-29 MED ORDER — METOPROLOL TARTRATE 25 MG PO TABS: 25.0000 mg | ORAL_TABLET | Freq: Two times a day (BID) | ORAL | Status: DC

## 2011-03-31 ENCOUNTER — Other Ambulatory Visit: Payer: Self-pay | Admitting: *Deleted

## 2011-03-31 MED ORDER — RIZATRIPTAN BENZOATE 10 MG PO TABS: 10.0000 mg | ORAL_TABLET | ORAL | Status: DC | PRN

## 2011-03-31 MED ORDER — METOPROLOL TARTRATE 25 MG PO TABS: 25.0000 mg | ORAL_TABLET | Freq: Two times a day (BID) | ORAL | Status: DC

## 2011-03-31 MED ORDER — HYDROCHLOROTHIAZIDE 12.5 MG PO CAPS: 12.5000 mg | ORAL_CAPSULE | ORAL | Status: DC

## 2011-05-04 ENCOUNTER — Encounter: Payer: Self-pay | Admitting: Internal Medicine

## 2011-05-05 ENCOUNTER — Ambulatory Visit (INDEPENDENT_AMBULATORY_CARE_PROVIDER_SITE_OTHER): Payer: 59 | Admitting: Internal Medicine

## 2011-05-05 ENCOUNTER — Ambulatory Visit: Payer: 59

## 2011-05-05 ENCOUNTER — Encounter: Payer: Self-pay | Admitting: Internal Medicine

## 2011-05-05 VITALS — BP 102/72 | HR 65 | Temp 98.6°F | Ht 63.0 in | Wt 140.6 lb

## 2011-05-05 DIAGNOSIS — Z1211 Encounter for screening for malignant neoplasm of colon: Secondary | ICD-10-CM

## 2011-05-05 DIAGNOSIS — Z0000 Encounter for general adult medical examination without abnormal findings: Secondary | ICD-10-CM

## 2011-05-05 DIAGNOSIS — I10 Essential (primary) hypertension: Secondary | ICD-10-CM

## 2011-05-05 DIAGNOSIS — R5383 Other fatigue: Secondary | ICD-10-CM

## 2011-05-05 DIAGNOSIS — R5381 Other malaise: Secondary | ICD-10-CM

## 2011-05-05 DIAGNOSIS — Z136 Encounter for screening for cardiovascular disorders: Secondary | ICD-10-CM

## 2011-05-05 LAB — CBC WITH DIFFERENTIAL/PLATELET
Basophils Relative: 0.9 % (ref 0.0–3.0)
Eosinophils Absolute: 0.4 10*3/uL (ref 0.0–0.7)
MCHC: 33.9 g/dL (ref 30.0–36.0)
MCV: 91.1 fl (ref 78.0–100.0)
Monocytes Absolute: 0.7 10*3/uL (ref 0.1–1.0)
Neutrophils Relative %: 48.9 % (ref 43.0–77.0)
Platelets: 277 10*3/uL (ref 150.0–400.0)
RBC: 4.23 Mil/uL (ref 3.87–5.11)
RDW: 13 % (ref 11.5–14.6)

## 2011-05-05 LAB — LIPID PANEL
Total CHOL/HDL Ratio: 5
VLDL: 38.4 mg/dL (ref 0.0–40.0)

## 2011-05-05 LAB — IBC PANEL: Iron: 54 ug/dL (ref 42–145)

## 2011-05-05 LAB — BASIC METABOLIC PANEL
BUN: 14 mg/dL (ref 6–23)
CO2: 28 mEq/L (ref 19–32)
Chloride: 102 mEq/L (ref 96–112)
Creatinine, Ser: 0.7 mg/dL (ref 0.4–1.2)
Glucose, Bld: 101 mg/dL — ABNORMAL HIGH (ref 70–99)

## 2011-05-05 NOTE — Progress Notes (Signed)
Subjective:    Patient ID: [name], female    DOB: [birth year], 50 y.o.   MRN: [id number]  HPI  patient is here today for annual physical. Patient feels well overall but complains of fatigue and insomnia  Also reviewed chronic medical issues: HTN - on diuretic and beta-blocker (which helps control palpiatation symptoms) - no chest pain, shortness of breath or   LBP/sciatica - all pain symptoms resolved following 09/2010 fusion L5-S1  Past Medical History  Diagnosis Date  . DIVERTICULITIS, HX OF   . OSTEOPENIA   . MENOPAUSAL SYNDROME   . MIGRAINE UNSP W/O INTRACT W/O STATUS MIGRAINOSUS   . HYPERTENSION   . SCIATICA, LEFT     resolved s/p l5-s1 fusion 09/2010  . ANEMIA-NOS    Family History  Problem Relation Age of Onset  . Arthritis Other     grandparent  . Diabetes Other     grandparent  . Hypothyroidism Other     parent & grandparent  . Coronary artery disease Other   . Hypertension Other    History  Substance Use Topics  . Smoking status: Never Smoker   . Smokeless tobacco: Not on file  . Alcohol Use: No     Review of Systems Constitutional: Negative for fever. Positive for fatigue Respiratory: Negative for cough and shortness of breath.   Cardiovascular: Negative for chest pain.  Gastrointestinal: Negative for abdominal pain.  Musculoskeletal: Negative for gait problem.  Skin: Negative for rash.  Neurological: Negative for dizziness.  No other specific complaints in a complete review of systems (except as listed in HPI above).     Objective:   Physical Exam BP 102/72  Pulse 65  Temp(Src) 98.6 F (37 C) (Oral)  Ht 5\' 3"  (1.6 m)  Wt 140 lb 9.6 oz (63.776 kg)  BMI 24.91 kg/m2  SpO2 98% Wt Readings from Last 3 Encounters:  05/05/11 140 lb 9.6 oz (63.776 kg)  03/27/10 133 lb 1.9 oz (60.382 kg)  01/10/09 123 lb 6.4 oz (55.974 kg)    Constitutional: She is oriented to person, place, and time. She appears well-developed and well-nourished. No  distress.  HENT: Head: Normocephalic and atraumatic. Ears; B TMs ok, no erythema or effusion; Nose: Nose normal.  Mouth/Throat: Oropharynx is clear and moist. No oropharyngeal exudate.  Eyes: Conjunctivae and EOM are normal. Pupils are equal, round, and reactive to light. No scleral icterus.  Neck: Normal range of motion. Neck supple. No JVD present. No thyromegaly present.  Cardiovascular: Normal rate, regular rhythm and normal heart sounds.  No murmur heard. No BLE edema. Pulmonary/Chest: Effort normal and breath sounds normal. No respiratory distress. She has no wheezes.  Abdominal: Soft. Bowel sounds are normal. She exhibits no distension. There is no tenderness.  Musculoskeletal: Normal range of motion, no joint effusions. No gross deformities Neurological: She is alert and oriented to person, place, and time. No cranial nerve deficit. Coordination normal.  Skin: Skin is warm and dry. No rash noted. No erythema.  Psychiatric: She has a normal mood and affect. Her behavior is normal. Judgment and thought content normal.   Lab Results  Component Value Date   WBC 6.5 09/11/2010   HGB 13.7 09/11/2010   HCT 40.3 09/11/2010   PLT 272 09/11/2010   CHOL 189 03/27/2010   TRIG 210.0* 03/27/2010   HDL 44.60 03/27/2010   LDLDIRECT 131.4 03/27/2010   ALT 51* 09/11/2010   AST 41* 09/11/2010   NA 140 09/11/2010   K 4.8 09/11/2010  CL 107 09/11/2010   CREATININE 0.68 09/11/2010   BUN 12 09/11/2010   CO2 29 09/11/2010   TSH 0.68 03/27/2010   INR 0.95 09/11/2010       Assessment & Plan:  CPX - v70.0 - Patient has been counseled on age-appropriate routine health concerns for screening and prevention. These are reviewed and up-to-date. Immunizations are up-to-date or declined. Labs ordered and ECG reviewed.  Also See problem list. Medications and labs reviewed today.  Fatigue - weight gain noted with decreased activity despite resolution of pain symptoms - nonspecific hx and exam; ?related to relative  overtx of HTN - see below - also review labs as for CPX above and iron given hx anemia

## 2011-05-05 NOTE — Assessment & Plan Note (Signed)
BP Readings from Last 3 Encounters:  05/05/11 102/72  03/27/10 120/78  01/10/09 124/82   Given fatigue and mild low BP, reduce beta-blocker Consider stopping HCTZ too depending on labs

## 2011-05-05 NOTE — Patient Instructions (Signed)
It was good to see you today. Exam and EKG look ok today Test(s) ordered today. Your results will be called to you after review (48-72hours after test completion). If any changes need to be made, you will be notified at that time. we'll make referral to [facility] for screening colonoscopy. Our office will contact you regarding appointment(s) once made.  Reduce dose of beta-blocker due to low blood pressure and fatigue .follow up with mammogram and gynecology check - let us know if you need a referral for these appointments!

## 2011-05-06 LAB — HEPATIC FUNCTION PANEL
Alkaline Phosphatase: 88 U/L (ref 39–117)
Bilirubin, Direct: 0.1 mg/dL (ref 0.0–0.3)
Total Bilirubin: 0.4 mg/dL (ref 0.3–1.2)

## 2011-05-07 ENCOUNTER — Other Ambulatory Visit: Payer: Self-pay | Admitting: Internal Medicine

## 2011-05-07 DIAGNOSIS — Z79899 Other long term (current) drug therapy: Secondary | ICD-10-CM

## 2011-05-07 DIAGNOSIS — E785 Hyperlipidemia, unspecified: Secondary | ICD-10-CM | POA: Insufficient documentation

## 2011-05-07 MED ORDER — SIMVASTATIN 20 MG PO TABS: 20.0000 mg | ORAL_TABLET | Freq: Every evening | ORAL | Status: DC

## 2011-07-09 LAB — POCT URINALYSIS DIP (DEVICE)
Bilirubin Urine: NEGATIVE
Glucose, UA: NEGATIVE mg/dL
Ketones, ur: NEGATIVE mg/dL
Nitrite: NEGATIVE
Protein, ur: NEGATIVE mg/dL
Specific Gravity, Urine: 1.01 (ref 1.005–1.030)
Urobilinogen, UA: 0.2 mg/dL (ref 0.0–1.0)
pH: 7 (ref 5.0–8.0)

## 2011-07-16 ENCOUNTER — Encounter: Payer: Self-pay | Admitting: Gastroenterology

## 2011-09-14 ENCOUNTER — Other Ambulatory Visit: Payer: Self-pay | Admitting: *Deleted

## 2011-09-14 MED ORDER — RIZATRIPTAN BENZOATE 10 MG PO TABS: 10.0000 mg | ORAL_TABLET | ORAL | Status: DC | PRN

## 2011-09-14 NOTE — Telephone Encounter (Signed)
MD sent renewal to pharmacy...09/14/11@1 :58pm/LMB

## 2011-11-03 ENCOUNTER — Ambulatory Visit: Payer: 59 | Admitting: Internal Medicine

## 2011-11-11 ENCOUNTER — Other Ambulatory Visit: Payer: Self-pay | Admitting: Internal Medicine

## 2012-01-04 ENCOUNTER — Other Ambulatory Visit: Payer: Self-pay | Admitting: *Deleted

## 2012-01-04 MED ORDER — RIZATRIPTAN BENZOATE 10 MG PO TABS: 10.0000 mg | ORAL_TABLET | ORAL | Status: DC | PRN

## 2012-02-03 ENCOUNTER — Other Ambulatory Visit: Payer: Self-pay | Admitting: Internal Medicine

## 2012-02-08 ENCOUNTER — Other Ambulatory Visit: Payer: Self-pay | Admitting: Internal Medicine

## 2012-05-24 ENCOUNTER — Other Ambulatory Visit: Payer: Self-pay | Admitting: Internal Medicine

## 2012-06-27 ENCOUNTER — Other Ambulatory Visit: Payer: Self-pay | Admitting: Internal Medicine

## 2012-07-13 ENCOUNTER — Other Ambulatory Visit (INDEPENDENT_AMBULATORY_CARE_PROVIDER_SITE_OTHER): Payer: 59

## 2012-07-13 ENCOUNTER — Encounter: Payer: Self-pay | Admitting: Internal Medicine

## 2012-07-13 ENCOUNTER — Ambulatory Visit (INDEPENDENT_AMBULATORY_CARE_PROVIDER_SITE_OTHER): Payer: 59 | Admitting: Internal Medicine

## 2012-07-13 VITALS — BP 118/80 | HR 64 | Temp 98.3°F | Ht 63.0 in | Wt 140.0 lb

## 2012-07-13 DIAGNOSIS — R0989 Other specified symptoms and signs involving the circulatory and respiratory systems: Secondary | ICD-10-CM

## 2012-07-13 DIAGNOSIS — Z1231 Encounter for screening mammogram for malignant neoplasm of breast: Secondary | ICD-10-CM

## 2012-07-13 DIAGNOSIS — R0609 Other forms of dyspnea: Secondary | ICD-10-CM

## 2012-07-13 DIAGNOSIS — I10 Essential (primary) hypertension: Secondary | ICD-10-CM

## 2012-07-13 DIAGNOSIS — Z0000 Encounter for general adult medical examination without abnormal findings: Secondary | ICD-10-CM

## 2012-07-13 DIAGNOSIS — Z1211 Encounter for screening for malignant neoplasm of colon: Secondary | ICD-10-CM

## 2012-07-13 DIAGNOSIS — Z23 Encounter for immunization: Secondary | ICD-10-CM

## 2012-07-13 DIAGNOSIS — Z1239 Encounter for other screening for malignant neoplasm of breast: Secondary | ICD-10-CM

## 2012-07-13 DIAGNOSIS — E785 Hyperlipidemia, unspecified: Secondary | ICD-10-CM

## 2012-07-13 DIAGNOSIS — R0683 Snoring: Secondary | ICD-10-CM

## 2012-07-13 LAB — CBC WITH DIFFERENTIAL/PLATELET
Basophils Relative: 1 % (ref 0.0–3.0)
Eosinophils Relative: 3.8 % (ref 0.0–5.0)
HCT: 42.4 % (ref 36.0–46.0)
Hemoglobin: 14 g/dL (ref 12.0–15.0)
Lymphs Abs: 2.6 10*3/uL (ref 0.7–4.0)
Monocytes Relative: 7.4 % (ref 3.0–12.0)
Neutro Abs: 4 10*3/uL (ref 1.4–7.7)
Platelets: 294 10*3/uL (ref 150.0–400.0)
RBC: 4.67 Mil/uL (ref 3.87–5.11)
WBC: 7.6 10*3/uL (ref 4.5–10.5)

## 2012-07-13 LAB — HEPATIC FUNCTION PANEL
ALT: 51 U/L — ABNORMAL HIGH (ref 0–35)
Albumin: 4 g/dL (ref 3.5–5.2)
Total Protein: 8 g/dL (ref 6.0–8.3)

## 2012-07-13 LAB — LIPID PANEL
Cholesterol: 161 mg/dL (ref 0–200)
HDL: 45.5 mg/dL (ref >39.00)
LDL Cholesterol: 93 mg/dL (ref 0–99)
VLDL: 22.8 mg/dL (ref 0.0–40.0)

## 2012-07-13 LAB — URINALYSIS, ROUTINE W REFLEX MICROSCOPIC
Bilirubin Urine: NEGATIVE
Nitrite: NEGATIVE
Total Protein, Urine: NEGATIVE
Urine Glucose: NEGATIVE
pH: 7 (ref 5.0–8.0)

## 2012-07-13 LAB — BASIC METABOLIC PANEL
Chloride: 104 mEq/L (ref 96–112)
GFR: 87.07 mL/min (ref >60.00)
Potassium: 4.4 mEq/L (ref 3.5–5.1)
Sodium: 138 mEq/L (ref 135–145)

## 2012-07-13 LAB — TSH: TSH: 0.96 u[IU]/mL (ref 0.35–5.50)

## 2012-07-13 MED ORDER — METOPROLOL SUCCINATE ER 25 MG PO TB24: 25.0000 mg | ORAL_TABLET | Freq: Every day | ORAL | Status: DC

## 2012-07-13 NOTE — Patient Instructions (Addendum)
It was good to see you today. We have reviewed your prior records including labs and tests today Test(s) ordered today. Your results will be released to MyChart (or called to you) after review, usually within 72hours after test completion. If any changes need to be made, you will be notified at that same time. Health Maintenance reviewed - all recommended immunizations and age-appropriate screenings are up-to-date. we'll make referral for colonoscopy screening, mammogram and sleep study . Our office will contact you regarding appointment(s) once made. Flu shot given today Medications reviewed, change to extended release Toprol,  Refill on medication(s) as discussed today. Health Maintenance, Females A healthy lifestyle and preventative care can promote health and wellness.  Maintain regular health, dental, and eye exams.   Eat a healthy diet. Foods like vegetables, fruits, whole grains, low-fat dairy products, and lean protein foods contain the nutrients you need without too many calories. Decrease your intake of foods high in solid fats, added sugars, and salt. Get information about a proper diet from your caregiver, if necessary.   Regular physical exercise is one of the most important things you can do for your health. Most adults should get at least 150 minutes of moderate-intensity exercise (any activity that increases your heart rate and causes you to sweat) each week. In addition, most adults need muscle-strengthening exercises on 2 or more days a week.     Maintain a healthy weight. The body mass index (BMI) is a screening tool to identify possible weight problems. It provides an estimate of body fat based on height and weight. Your caregiver can help determine your BMI, and can help you achieve or maintain a healthy weight. For adults 20 years and older:   A BMI below 18.5 is considered underweight.   A BMI of 18.5 to 24.9 is normal.   A BMI of 25 to 29.9 is considered overweight.    A BMI of 30 and above is considered obese.   Maintain normal blood lipids and cholesterol by exercising and minimizing your intake of saturated fat. Eat a balanced diet with plenty of fruits and vegetables. Blood tests for lipids and cholesterol should begin at age 54 and be repeated every 5 years. If your lipid or cholesterol levels are high, you are over 50, or you are a high risk for heart disease, you may need your cholesterol levels checked more frequently. Ongoing high lipid and cholesterol levels should be treated with medicines if diet and exercise are not effective.   If you smoke, find out from your caregiver how to quit. If you do not use tobacco, do not start.   If you are pregnant, do not drink alcohol. If you are breastfeeding, be very cautious about drinking alcohol. If you are not pregnant and choose to drink alcohol, do not exceed 1 drink per day. One drink is considered to be 12 ounces (355 mL) of beer, 5 ounces (148 mL) of wine, or 1.5 ounces (44 mL) of liquor.   Avoid use of street drugs. Do not share needles with anyone. Ask for help if you need support or instructions about stopping the use of drugs.   High blood pressure causes heart disease and increases the risk of stroke. Blood pressure should be checked at least every 1 to 2 years. Ongoing high blood pressure should be treated with medicines, if weight loss and exercise are not effective.   If you are 65 to 53 years old, ask your caregiver if you should take aspirin  to prevent strokes.   Diabetes screening involves taking a blood sample to check your fasting blood sugar level. This should be done once every 3 years, after age 73, if you are within normal weight and without risk factors for diabetes. Testing should be considered at a younger age or be carried out more frequently if you are overweight and have at least 1 risk factor for diabetes.   Breast cancer screening is essential preventative care for women. You  should practice "breast self-awareness." This means understanding the normal appearance and feel of your breasts and may include breast self-examination. Any changes detected, no matter how small, should be reported to a caregiver. Women in their 89s and 30s should have a clinical breast exam (CBE) by a caregiver as part of a regular health exam every 1 to 3 years. After age 78, women should have a CBE every year. Starting at age 58, women should consider having a mammogram (breast X-ray) every year. Women who have a family history of breast cancer should talk to their caregiver about genetic screening. Women at a high risk of breast cancer should talk to their caregiver about having an MRI and a mammogram every year.   The Pap test is a screening test for cervical cancer. Women should have a Pap test starting at age 38. Between ages 43 and 66, Pap tests should be repeated every 2 years. Beginning at age 70, you should have a Pap test every 3 years as long as the past 3 Pap tests have been normal. If you had a hysterectomy for a problem that was not cancer or a condition that could lead to cancer, then you no longer need Pap tests. If you are between ages 92 and 22, and you have had normal Pap tests going back 10 years, you no longer need Pap tests. If you have had past treatment for cervical cancer or a condition that could lead to cancer, you need Pap tests and screening for cancer for at least 20 years after your treatment. If Pap tests have been discontinued, risk factors (such as a new sexual partner) need to be reassessed to determine if screening should be resumed. Some women have medical problems that increase the chance of getting cervical cancer. In these cases, your caregiver may recommend more frequent screening and Pap tests.   The human papillomavirus (HPV) test is an additional test that may be used for cervical cancer screening. The HPV test looks for the virus that can cause the cell changes on  the cervix. The cells collected during the Pap test can be tested for HPV. The HPV test could be used to screen women aged 49 years and older, and should be used in women of any age who have unclear Pap test results. After the age of 69, women should have HPV testing at the same frequency as a Pap test.   Colorectal cancer can be detected and often prevented. Most routine colorectal cancer screening begins at the age of 18 and continues through age 64. However, your caregiver may recommend screening at an earlier age if you have risk factors for colon cancer. On a yearly basis, your caregiver may provide home test kits to check for hidden blood in the stool. Use of a small camera at the end of a tube, to directly examine the colon (sigmoidoscopy or colonoscopy), can detect the earliest forms of colorectal cancer. Talk to your caregiver about this at age 67, when routine screening begins. Direct  examination of the colon should be repeated every 5 to 10 years through age 16, unless early forms of pre-cancerous polyps or small growths are found.   Hepatitis C blood testing is recommended for all people born from [birth year] through 1967 and any individual with known risks for hepatitis C.   Practice safe sex. Use condoms and avoid high-risk sexual practices to reduce the spread of sexually transmitted infections (STIs). Sexually active women aged 75 and younger should be checked for Chlamydia, which is a common sexually transmitted infection. Older women with new or multiple partners should also be tested for Chlamydia. Testing for other STIs is recommended if you are sexually active and at increased risk.   Osteoporosis is a disease in which the bones lose minerals and strength with aging. This can result in serious bone fractures. The risk of osteoporosis can be identified using a bone density scan. Women ages 6 and over and women at risk for fractures or osteoporosis should discuss screening with their caregivers.  Ask your caregiver whether you should be taking a calcium supplement or vitamin D to reduce the rate of osteoporosis.   Menopause can be associated with physical symptoms and risks. Hormone replacement therapy is available to decrease symptoms and risks. You should talk to your caregiver about whether hormone replacement therapy is right for you.   Use sunscreen with a sun protection factor (SPF) of 30 or greater. Apply sunscreen liberally and repeatedly throughout the day. You should seek shade when your shadow is shorter than you. Protect yourself by wearing long sleeves, pants, a wide-brimmed hat, and sunglasses year round, whenever you are outdoors.   Notify your caregiver of new moles or changes in moles, especially if there is a change in shape or color. Also notify your caregiver if a mole is larger than the size of a pencil eraser.   Stay current with your immunizations.  Document Released: 04/05/2011 Document Revised: 12/13/2011 Document Reviewed: 04/05/2011 [facility] [employer] Patient Information 2013 [place], [place].

## 2012-07-13 NOTE — Assessment & Plan Note (Signed)
BP Readings from Last 3 Encounters:  07/13/12 118/80  05/05/11 102/72  03/27/10 120/78   Reduce beta blocker August 2012 due to low blood pressure and fatigue  Changed to extended release at this time  Continue diuretic as needed

## 2012-07-13 NOTE — Assessment & Plan Note (Signed)
Started low dose simvastatin August 2012 after review of physical lipids Tolerating medication well, recheck labs and continue same, titrate as needed

## 2012-07-13 NOTE — Progress Notes (Signed)
Subjective:    Patient ID: [name], female    DOB: 1961/04/11, 51 y.o.   MRN: [id number]  HPI   patient is here today for annual physical. Patient feels well overall but complains of fatigue, snoring and insomnia  Also reviewed chronic medical issues: hypertension - on diuretic and beta-blocker (which helps control palpitations symptoms) - no chest pain, shortness of breath or headache  low back pain/L sciatica - all pain symptoms resolved following 09/2010 fusion L5-S1  Past Medical History  Diagnosis Date  . DIVERTICULITIS, HX OF   . OSTEOPENIA   . MENOPAUSAL SYNDROME   . MIGRAINE UNSP W/O INTRACT W/O STATUS MIGRAINOSUS   . HYPERTENSION   . SCIATICA, LEFT     resolved s/p l5-s1 fusion 09/2010  . ANEMIA-NOS    Family History  Problem Relation Age of Onset  . Arthritis Other     grandparent  . Diabetes Other     grandparent  . Hypothyroidism Other     parent & grandparent  . Coronary artery disease Other   . Hypertension Other    History  Substance Use Topics  . Smoking status: Never Smoker   . Smokeless tobacco: Not on file  . Alcohol Use: No    Review of Systems  Constitutional: Negative for fever. Positive for fatigue Respiratory: Negative for cough and shortness of breath.   Cardiovascular: Negative for chest pain.  Gastrointestinal: Negative for abdominal pain.  Musculoskeletal: Negative for gait problem.  Skin: Negative for rash.  Neurological: Negative for dizziness.  No other specific complaints in a complete review of systems (except as listed in HPI above).     Objective:   Physical Exam  BP 118/80  Pulse 64  Temp 98.3 F (36.8 C) (Oral)  Ht 5\' 3"  (1.6 m)  Wt 140 lb (63.504 kg)  BMI 24.80 kg/m2  SpO2 97% Wt Readings from Last 3 Encounters:  07/13/12 140 lb (63.504 kg)  05/05/11 140 lb 9.6 oz (63.776 kg)  03/27/10 133 lb 1.9 oz (60.382 kg)    Constitutional: She appears well-developed and well-nourished. No distress.  HENT:  Head: Normocephalic and atraumatic. Ears; B TMs ok, no erythema or effusion; Nose: Nose normal. Mouth/Throat: Oropharynx is clear and moist. No oropharyngeal exudate.  Eyes: Conjunctivae and EOM are normal. Pupils are equal, round, and reactive to light. No scleral icterus.  Neck: Normal range of motion. Neck supple. No JVD present. No thyromegaly present.  Cardiovascular: Normal rate, regular rhythm and normal heart sounds.  No murmur heard. No BLE edema. Pulmonary/Chest: Effort normal and breath sounds normal. No respiratory distress. She has no wheezes.  Abdominal: Soft. Bowel sounds are normal. She exhibits no distension. There is no tenderness.  Musculoskeletal: Right 4th finger tender at a1 pulley - occasionally triggers with AROM, no swelling, NV intact, no signs infection. No joint effusions. No gross deformities Neurological: She is alert and oriented to person, place, and time. No cranial nerve deficit. Coordination normal.  Skin: Skin is warm and dry. No rash noted. No erythema.  Psychiatric: She has a normal mood and affect. Her behavior is normal. Judgment and thought content normal.   Lab Results  Component Value Date   WBC 6.9 05/05/2011   HGB 13.1 05/05/2011   HCT 38.6 05/05/2011   PLT 277.0 05/05/2011   CHOL 235* 05/05/2011   TRIG 192.0* 05/05/2011   HDL 48.90 05/05/2011   LDLDIRECT 158.5 05/05/2011   ALT 35 05/05/2011   AST 31 05/05/2011  NA 139 05/05/2011   K 4.4 05/05/2011   CL 102 05/05/2011   CREATININE 0.7 05/05/2011   BUN 14 05/05/2011   CO2 28 05/05/2011   TSH 0.91 05/05/2011   INR 0.95 09/11/2010       Assessment & Plan:  CPX - v70.0 - Patient has been counseled on age-appropriate routine health concerns for screening and prevention. These are reviewed and up-to-date. Immunizations are up-to-date or declined. Labs ordered and will be reviewed.  Also See problem list. Medications and labs reviewed today.  Fatigue - associated with snoring and hypertension -?underlying sleep apnea -  sister recently diagnosed with OSA and on CPAP - will arrange for sleep study (?home)  4th right finger, early tenosynovitis - conservative care recommended - will call if worse

## 2012-07-19 ENCOUNTER — Other Ambulatory Visit: Payer: Self-pay

## 2012-07-19 MED ORDER — RIZATRIPTAN BENZOATE 10 MG PO TABS: 10.0000 mg | ORAL_TABLET | ORAL | Status: DC | PRN

## 2012-08-08 ENCOUNTER — Ambulatory Visit: Payer: 59

## 2012-08-09 ENCOUNTER — Encounter: Payer: Self-pay | Admitting: Pulmonary Disease

## 2012-08-09 ENCOUNTER — Ambulatory Visit (INDEPENDENT_AMBULATORY_CARE_PROVIDER_SITE_OTHER): Payer: 59 | Admitting: Pulmonary Disease

## 2012-08-09 VITALS — BP 112/70 | HR 71 | Temp 98.3°F | Ht 63.0 in | Wt 142.0 lb

## 2012-08-09 DIAGNOSIS — R0989 Other specified symptoms and signs involving the circulatory and respiratory systems: Secondary | ICD-10-CM

## 2012-08-09 DIAGNOSIS — R0609 Other forms of dyspnea: Secondary | ICD-10-CM

## 2012-08-09 DIAGNOSIS — R0683 Snoring: Secondary | ICD-10-CM

## 2012-08-09 NOTE — Progress Notes (Deleted)
  Subjective:    Patient ID: [name], female    DOB: 08-12-1961, 51 y.o.   MRN: [id number]  HPI    Review of Systems  Constitutional: Negative for fever, appetite change and unexpected weight change.  HENT: Negative for ear pain, congestion, sore throat, sneezing, trouble swallowing and dental problem.   Respiratory: Negative for cough and shortness of breath.   Cardiovascular: Negative for chest pain, palpitations and leg swelling.  Gastrointestinal: Negative for abdominal pain.  Musculoskeletal: Negative for joint swelling.  Skin: Negative for rash.  Neurological: Positive for headaches.  Psychiatric/Behavioral: Negative for dysphoric mood. The patient is not nervous/anxious.        Objective:   Physical Exam        Assessment & Plan:

## 2012-08-09 NOTE — Patient Instructions (Signed)
Will arrange for sleep study Will call to arrange for follow up after sleep study reviewed 

## 2012-08-09 NOTE — Progress Notes (Signed)
Primary Care Physician:   Referring provider:    Chief Complaint  Patient presents with  . Advice Only    wakes up about every 15-20 mins. while in bed, snores now but didn't use to, feels tired all the time    History of Present Illness: [name] is a 51 y.o. female former smoker for  evaluation of sleep problems.  She has noticed more trouble feeling sleepy during the day.  She has also been told that she is snoring more.  This has been going on for the past 1 year after she started going through menopause.  She has a twin sister who has sleep apnea, and she is worried she could have sleep apnea also.  Her father also has sleep apnea.  She falls asleep quickly.  She tries to avoid looking at the clock.  She feels tired in the morning.  She does not usually get headaches in the morning.  She does not usually nap, but can fall asleep easily when sitting quiet.  She does not use anything to help her sleep.  She drinks 4 cups of in the morning.  Her Epworth score is 14 out of 24.  The patient denies sleep walking, sleep talking, bruxism, or nightmares.  There is no history of restless legs.  The patient denies sleep hallucinations, sleep paralysis, or cataplexy.   Tests:  Past Medical History  Diagnosis Date  . DIVERTICULITIS, HX OF   . OSTEOPENIA   . MENOPAUSAL SYNDROME   . MIGRAINE UNSP W/O INTRACT W/O STATUS MIGRAINOSUS   . HYPERTENSION   . SCIATICA, LEFT     resolved s/p l5-s1 fusion 09/2010  . ANEMIA-NOS   . Chronic migraine     Past Surgical History  Procedure Date  . Appendectomy 2007  . Abdominal hysterectomy 2006  . Bladder tact 2006  . Right l5-s1 laminectomy 02/2009    fusion 09/2010    Current Outpatient Prescriptions on File Prior to Visit  Medication Sig Dispense Refill  . aspirin-acetaminophen-caffeine (EXCEDRIN MIGRAINE) 250-250-65 MG per tablet Take 2 tablets by mouth every 6 (six) hours as needed.       . Black Cohosh 200 MG CAPS Take 1 capsule by  mouth daily.       . Cholecalciferol (VITAMIN D3) 1000 UNITS CAPS Take by mouth daily.        . hydrochlorothiazide (MICROZIDE) 12.5 MG capsule Take 12.5 mg by mouth daily.       . metoprolol succinate (TOPROL-XL) 25 MG 24 hr tablet Take 1 tablet (25 mg total) by mouth daily.  90 tablet  3  . rizatriptan (MAXALT) 10 MG tablet Take 1 tablet (10 mg total) by mouth as needed. May repeat in 2 hours if needed  12 tablet  0  . simvastatin (ZOCOR) 20 MG tablet TAKE 1 TABLET BY MOUTH EVERY EVENING  30 tablet  PRN    No Known Allergies  Family History  Problem Relation Age of Onset  . Arthritis Paternal Grandmother   . Diabetes Paternal Grandmother   . Hypothyroidism Maternal Grandmother   . Coronary artery disease Father   . Hypertension Mother   . Coronary artery disease Maternal Grandmother   . Coronary artery disease Paternal Grandmother   . Hypertension Father     History  Substance Use Topics  . Smoking status: Former Smoker -- 1.0 packs/day for 15 years    Types: Cigarettes    Quit date: 10/04/1992  . Smokeless tobacco: Not on  file  . Alcohol Use: Yes     Comment: glass wine per month    Review of Systems  Constitutional: Negative for fever, appetite change and unexpected weight change.  HENT: Negative for ear pain, congestion, sore throat, sneezing, trouble swallowing and dental problem.   Respiratory: Negative for cough and shortness of breath.   Cardiovascular: Negative for chest pain, palpitations and leg swelling.  Gastrointestinal: Negative for abdominal pain.  Musculoskeletal: Negative for joint swelling.  Skin: Negative for rash.  Neurological: Positive for headaches.  Psychiatric/Behavioral: Negative for dysphoric mood. The patient is not nervous/anxious.    Physical Exam: Filed Vitals:   08/09/12 1511 08/09/12 1520  BP:  112/70  Pulse:  71  Temp: 98.3 F (36.8 C)   TempSrc: Oral   Height: 5\' 3"  (1.6 m)   Weight: 142 lb (64.411 kg)   SpO2:  98%  ,    Current Encounter SPO2  08/09/12 1520 98%  07/13/12 0854 97%  05/05/11 1503 98%    Wt Readings from Last 3 Encounters:  08/09/12 142 lb (64.411 kg)  07/13/12 140 lb (63.504 kg)  05/05/11 140 lb 9.6 oz (63.776 kg)    Body mass index is 25.15 kg/(m^2).   General - No distress ENT - No sinus tenderness, no oral exudate, 2+ tonsills, MP 3, no LAN, no thyromegaly, TM clear, pupils equal/reactive Cardiac - s1s2 regular, no murmur, pulses symmetric Chest - No wheeze/rales/dullness, good air entry, normal respiratory excursion Back - No focal tenderness Abd - Soft, non-tender, no organomegaly, + bowel sounds Ext - No edema Neuro - Normal strength, cranial nerves intact Skin - No rashes Psych - Normal mood, and behavior.   Dg Chest 2 View  09/11/2010  Clinical Data: Preop for lumbar spine surgery   CHEST - 2 VIEW   Comparison: Chest x-ray of 01/31/2009   Findings: The lungs are clear.  Mediastinal contours appear normal. The heart is within normal limits in size.  No bony abnormality is seen.   IMPRESSION:   No active lung disease.  Provider: Rocky Link    Lab Results  Component Value Date   WBC 7.6 07/13/2012   HGB 14.0 07/13/2012   HCT 42.4 07/13/2012   MCV 90.8 07/13/2012   PLT 294.0 07/13/2012    Lab Results  Component Value Date   CREATININE 0.7 07/13/2012   BUN 15 07/13/2012   NA 138 07/13/2012   K 4.4 07/13/2012   CL 104 07/13/2012   CO2 28 07/13/2012    Lab Results  Component Value Date   ALT 51* 07/13/2012   AST 41* 07/13/2012   ALKPHOS 89 07/13/2012   BILITOT 0.6 07/13/2012    Lab Results  Component Value Date   TSH 0.96 07/13/2012    Assessment/Plan:  Coralyn Helling, MD [place] [facility] Pager:  [phone] 08/09/2012, 3:34 PM

## 2012-08-09 NOTE — Assessment & Plan Note (Addendum)
She has snoring, sleep disruption, and daytime sleepiness.  She has history of hypertension on 3 blood pressure medicines.  Her twin sister has sleep apnea.  I am concerned she also has sleep apnea.  I have explained how sleep apnea can affect the patient's health.  Driving precautions and importance of weight loss were discussed.  Treatment options for sleep apnea were reviewed.  To further assess will arrange for in lab sleep study.  Depending on results she may be good candidate for an oral appliance.

## 2012-09-07 ENCOUNTER — Other Ambulatory Visit: Payer: Self-pay | Admitting: Internal Medicine

## 2012-09-07 ENCOUNTER — Ambulatory Visit (HOSPITAL_BASED_OUTPATIENT_CLINIC_OR_DEPARTMENT_OTHER): Payer: 59 | Attending: Pulmonary Disease | Admitting: Radiology

## 2012-09-07 VITALS — Ht 63.0 in | Wt 140.0 lb

## 2012-09-07 DIAGNOSIS — G4733 Obstructive sleep apnea (adult) (pediatric): Secondary | ICD-10-CM | POA: Insufficient documentation

## 2012-09-07 DIAGNOSIS — R0683 Snoring: Secondary | ICD-10-CM

## 2012-09-12 ENCOUNTER — Telehealth: Payer: Self-pay | Admitting: Pulmonary Disease

## 2012-09-12 DIAGNOSIS — G4733 Obstructive sleep apnea (adult) (pediatric): Secondary | ICD-10-CM

## 2012-09-12 NOTE — Telephone Encounter (Signed)
PSG 09/07/12>>AHI 6.6, SpO2 82%, PLMI 0.  REM AHI 7.6, Supine AHI 18.4.  Will have my nurse inform patient that she has mild sleep apnea, and schedule ROV to discuss sleep study results in more detail.

## 2012-09-13 NOTE — Telephone Encounter (Signed)
[id number] x1 

## 2012-09-13 NOTE — Procedures (Signed)
NAME:  [name], MARINACCIO NO.:  [id number]  MEDICAL RECORD NO.:  [id number]          PATIENT TYPE:  OUT  LOCATION:  SLEEP CENTER                 FACILITY:  [facility]  PHYSICIAN:  Coralyn Helling, MD        DATE OF BIRTH:  Jun 02, 1961  DATE OF STUDY:  09/07/2012                           NOCTURNAL POLYSOMNOGRAM  REFERRING PHYSICIAN:  Coralyn Helling, MD  FACILITY:  [facility].  INDICATION FOR STUDY:  [name] is a 51 year old female who has a history of refractory hypertension.  She also reports snoring, sleep disruption, daytime sleepiness, and morning headaches.  She was referred to the Sleep Lab for evaluation of hypersomnia with obstructive sleep apnea.  Height is 5 feet and 3 inches, weight is 140 pounds, BMI is 25, neck size is 13.5 inches.  EPWORTH SLEEPINESS SCORE:  10.  MEDICATIONS:  Thyroid complex, hydrochlorothiazide, metoprolol, simvastatin, vitamin D3, and Maxalt.  SLEEP ARCHITECTURE:  Total recording time was 391 minutes, total sleep time was 337 minutes.  Sleep efficiency was 86%, sleep latency was 11 minutes, REM latency was 136 minutes.  The study was notable for lack of slow-wave sleep and she slept predominantly in the nonsupine position.  RESPIRATORY DATA:  The average respiratory rate was 14.  Moderate snoring was noted by the technician.  The overall apnea-hypopnea index was 6.6.  The events were exclusively obstructive in nature.  The REM apnea-hypopnea index was 7.6.  The supine apnea-hypopnea index was 18.5. The REM supine apnea-hypopnea index was 57.5.  OXYGEN DATA:  The baseline oxygenation was 95%.  The oxygen saturation nadir was 82%.  The study was conducted without the use of supplemental oxygen.  CARDIAC DATA:  The average heart rate was 71 and the rhythm strip showed sinus rhythm with occasional PACs and PVCs.  MOVEMENT-PARASOMNIA:  The periodic limb movement index was 0 and the patient had no restroom  trips.  IMPRESSIONS-RECOMMENDATIONS:  This study shows evidence for mild obstructive sleep apnea with an apnea-hypopnea index of 6.6 with an oxygen saturation nadir of 82%.  Of note is that she had the predominance of her respiratory events while in supine sleep and REM sleep.  Additional therapeutic interventions could include positional therapy, CPAP therapy, oral appliance, or surgical intervention.     Coralyn Helling, MD Diplomat, American Board of Sleep Medicine    VS/MEDQ  D:  09/12/2012 13:10:48  T:  09/13/2012 01:16:33  Job:  [id number]

## 2012-09-14 ENCOUNTER — Other Ambulatory Visit: Payer: Self-pay | Admitting: *Deleted

## 2012-09-14 MED ORDER — RIZATRIPTAN BENZOATE 10 MG PO TABS: 10.0000 mg | ORAL_TABLET | ORAL | Status: DC | PRN

## 2012-09-14 NOTE — Telephone Encounter (Signed)
Received fax stating pt requesting a 3 month supply on her rizartriptan...[name]

## 2012-09-14 NOTE — Telephone Encounter (Signed)
Pt returned call. [name]  

## 2012-09-14 NOTE — Telephone Encounter (Signed)
I spoke with pt and is aware of results. She stated she wants to wait until after the holidays to schedule the appt. She will cal back to do so. Nothing further was needed

## 2012-10-06 ENCOUNTER — Ambulatory Visit: Payer: 59

## 2013-03-01 ENCOUNTER — Emergency Department (HOSPITAL_COMMUNITY)
Admission: EM | Admit: 2013-03-01 | Discharge: 2013-03-01 | Discharge status: Absent without leave | Payer: 59 | Source: Home / Self Care

## 2013-08-09 ENCOUNTER — Other Ambulatory Visit: Payer: Self-pay

## 2013-09-06 ENCOUNTER — Other Ambulatory Visit: Payer: Self-pay | Admitting: Internal Medicine

## 2013-10-05 ENCOUNTER — Other Ambulatory Visit: Payer: Self-pay | Admitting: Internal Medicine

## 2013-10-05 ENCOUNTER — Telehealth: Payer: Self-pay | Admitting: Internal Medicine

## 2013-10-05 MED ORDER — METOPROLOL SUCCINATE ER 25 MG PO TB24: 25.0000 mg | ORAL_TABLET | Freq: Every day | ORAL | Status: DC

## 2013-10-05 MED ORDER — HYDROCHLOROTHIAZIDE 12.5 MG PO CAPS: 12.5000 mg | ORAL_CAPSULE | Freq: Every day | ORAL | Status: DC

## 2013-10-05 NOTE — Telephone Encounter (Signed)
Pt needs her blood pressure medicine refilled.  She uses [facility] out patient pharmacy.  She has set up a physical for Feb. 16.

## 2013-11-19 ENCOUNTER — Ambulatory Visit (INDEPENDENT_AMBULATORY_CARE_PROVIDER_SITE_OTHER): Payer: 59 | Admitting: Internal Medicine

## 2013-11-19 ENCOUNTER — Other Ambulatory Visit (INDEPENDENT_AMBULATORY_CARE_PROVIDER_SITE_OTHER): Payer: 59

## 2013-11-19 ENCOUNTER — Encounter: Payer: Self-pay | Admitting: Internal Medicine

## 2013-11-19 VITALS — BP 122/80 | HR 74 | Temp 98.0°F | Ht 63.0 in | Wt 138.1 lb

## 2013-11-19 DIAGNOSIS — I10 Essential (primary) hypertension: Secondary | ICD-10-CM

## 2013-11-19 DIAGNOSIS — Z1239 Encounter for other screening for malignant neoplasm of breast: Secondary | ICD-10-CM

## 2013-11-19 DIAGNOSIS — Z0000 Encounter for general adult medical examination without abnormal findings: Secondary | ICD-10-CM

## 2013-11-19 DIAGNOSIS — Z1211 Encounter for screening for malignant neoplasm of colon: Secondary | ICD-10-CM

## 2013-11-19 LAB — URINALYSIS, ROUTINE W REFLEX MICROSCOPIC
BILIRUBIN URINE: NEGATIVE
KETONES UR: NEGATIVE
LEUKOCYTES UA: NEGATIVE
Nitrite: NEGATIVE
Specific Gravity, Urine: 1.025 (ref 1.000–1.030)
Total Protein, Urine: NEGATIVE
UROBILINOGEN UA: 0.2 (ref 0.0–1.0)
Urine Glucose: NEGATIVE
pH: 5.5 (ref 5.0–8.0)

## 2013-11-19 LAB — BASIC METABOLIC PANEL
BUN: 17 mg/dL (ref 6–23)
CO2: 27 mEq/L (ref 19–32)
Calcium: 9.9 mg/dL (ref 8.4–10.5)
Chloride: 106 mEq/L (ref 96–112)
Creatinine, Ser: 0.7 mg/dL (ref 0.4–1.2)
GFR: 92.36 mL/min (ref >60.00)
GLUCOSE: 87 mg/dL (ref 70–99)
POTASSIUM: 4.6 meq/L (ref 3.5–5.1)
SODIUM: 143 meq/L (ref 135–145)

## 2013-11-19 LAB — CBC WITH DIFFERENTIAL/PLATELET
Basophils Absolute: 0 10*3/uL (ref 0.0–0.1)
Basophils Relative: 0.4 % (ref 0.0–3.0)
Eosinophils Absolute: 0.4 10*3/uL (ref 0.0–0.7)
Eosinophils Relative: 4.5 % (ref 0.0–5.0)
HEMATOCRIT: 43.3 % (ref 36.0–46.0)
HEMOGLOBIN: 14.3 g/dL (ref 12.0–15.0)
Lymphocytes Relative: 33.2 % (ref 12.0–46.0)
Lymphs Abs: 2.7 10*3/uL (ref 0.7–4.0)
MCHC: 33.1 g/dL (ref 30.0–36.0)
MCV: 91.2 fl (ref 78.0–100.0)
MONO ABS: 0.6 10*3/uL (ref 0.1–1.0)
Monocytes Relative: 7 % (ref 3.0–12.0)
NEUTROS ABS: 4.4 10*3/uL (ref 1.4–7.7)
Neutrophils Relative %: 54.9 % (ref 43.0–77.0)
PLATELETS: 298 10*3/uL (ref 150.0–400.0)
RBC: 4.74 Mil/uL (ref 3.87–5.11)
RDW: 13.2 % (ref 11.5–14.6)
WBC: 8 10*3/uL (ref 4.5–10.5)

## 2013-11-19 LAB — HEPATIC FUNCTION PANEL
ALT: 65 U/L — ABNORMAL HIGH (ref 0–35)
AST: 45 U/L — ABNORMAL HIGH (ref 0–37)
Albumin: 4.5 g/dL (ref 3.5–5.2)
Alkaline Phosphatase: 95 U/L (ref 39–117)
BILIRUBIN DIRECT: 0 mg/dL (ref 0.0–0.3)
Total Bilirubin: 0.4 mg/dL (ref 0.3–1.2)
Total Protein: 8.3 g/dL (ref 6.0–8.3)

## 2013-11-19 LAB — LIPID PANEL
CHOL/HDL RATIO: 4
Cholesterol: 190 mg/dL (ref 0–200)
HDL: 51.8 mg/dL (ref >39.00)
LDL Cholesterol: 109 mg/dL — ABNORMAL HIGH (ref 0–99)
Triglycerides: 144 mg/dL (ref 0.0–149.0)
VLDL: 28.8 mg/dL (ref 0.0–40.0)

## 2013-11-19 LAB — TSH: TSH: 1.01 u[IU]/mL (ref 0.35–5.50)

## 2013-11-19 NOTE — Patient Instructions (Addendum)
It was good to see you today.  We have reviewed your prior records including labs and tests today  Will refer for mammogram at [facility] - my office will call with this appointment once arranged  We'll have you screened for colon cancer with [facility] (stool DNA testing) - this packet will be sent to your home by the testing company. Complete instructions as in the box, and we will contact you with the results once available.  Other Health Maintenance reviewed - all other recommended immunizations and age-appropriate screenings are up-to-date.  Test(s) ordered today. Your results will be released to [place] (or called to you) after review, usually within 72hours after test completion. If any changes need to be made, you will be notified at that same time.  Medications reviewed and updated, no changes recommended at this time.  Please schedule followup in 12 months for annual exam and labs, call sooner if problems.  Health Maintenance, Female A healthy lifestyle and preventative care can promote health and wellness.  Maintain regular health, dental, and eye exams.  Eat a healthy diet. Foods like vegetables, fruits, whole grains, low-fat dairy products, and lean protein foods contain the nutrients you need without too many calories. Decrease your intake of foods high in solid fats, added sugars, and salt. Get information about a proper diet from your caregiver, if necessary.  Regular physical exercise is one of the most important things you can do for your health. Most adults should get at least 150 minutes of moderate-intensity exercise (any activity that increases your heart rate and causes you to sweat) each week. In addition, most adults need muscle-strengthening exercises on 2 or more days a week.   Maintain a healthy weight. The body mass index (BMI) is a screening tool to identify possible weight problems. It provides an estimate of body fat based on height and weight. Your caregiver can  help determine your BMI, and can help you achieve or maintain a healthy weight. For adults 20 years and older:  A BMI below 18.5 is considered underweight.  A BMI of 18.5 to 24.9 is normal.  A BMI of 25 to 29.9 is considered overweight.  A BMI of 30 and above is considered obese.  Maintain normal blood lipids and cholesterol by exercising and minimizing your intake of saturated fat. Eat a balanced diet with plenty of fruits and vegetables. Blood tests for lipids and cholesterol should begin at age 64 and be repeated every 5 years. If your lipid or cholesterol levels are high, you are over 50, or you are a high risk for heart disease, you may need your cholesterol levels checked more frequently.Ongoing high lipid and cholesterol levels should be treated with medicines if diet and exercise are not effective.  If you smoke, find out from your caregiver how to quit. If you do not use tobacco, do not start.  Lung cancer screening is recommended for adults aged 65 80 years who are at high risk for developing lung cancer because of a history of smoking. Yearly low-dose computed tomography (CT) is recommended for people who have at least a 30-pack-year history of smoking and are a current smoker or have quit within the past 15 years. A pack year of smoking is smoking an average of 1 pack of cigarettes a day for 1 year (for example: 1 pack a day for 30 years or 2 packs a day for 15 years). Yearly screening should continue until the smoker has stopped smoking for at least 15  years. Yearly screening should also be stopped for people who develop a health problem that would prevent them from having lung cancer treatment.  If you are pregnant, do not drink alcohol. If you are breastfeeding, be very cautious about drinking alcohol. If you are not pregnant and choose to drink alcohol, do not exceed 1 drink per day. One drink is considered to be 12 ounces (355 mL) of beer, 5 ounces (148 mL) of wine, or 1.5 ounces  (44 mL) of liquor.  Avoid use of street drugs. Do not share needles with anyone. Ask for help if you need support or instructions about stopping the use of drugs.  High blood pressure causes heart disease and increases the risk of stroke. Blood pressure should be checked at least every 1 to 2 years. Ongoing high blood pressure should be treated with medicines, if weight loss and exercise are not effective.  If you are 63 to 55 years old, ask your caregiver if you should take aspirin to prevent strokes.  Diabetes screening involves taking a blood sample to check your fasting blood sugar level. This should be done once every 3 years, after age 16, if you are within normal weight and without risk factors for diabetes. Testing should be considered at a younger age or be carried out more frequently if you are overweight and have at least 1 risk factor for diabetes.  Breast cancer screening is essential preventative care for women. You should practice "breast self-awareness." This means understanding the normal appearance and feel of your breasts and may include breast self-examination. Any changes detected, no matter how small, should be reported to a caregiver. Women in their 27s and 30s should have a clinical breast exam (CBE) by a caregiver as part of a regular health exam every 1 to 3 years. After age 50, women should have a CBE every year. Starting at age 76, women should consider having a mammogram (breast X-ray) every year. Women who have a family history of breast cancer should talk to their caregiver about genetic screening. Women at a high risk of breast cancer should talk to their caregiver about having an MRI and a mammogram every year.  Breast cancer gene (BRCA)-related cancer risk assessment is recommended for women who have family members with BRCA-related cancers. BRCA-related cancers include breast, ovarian, tubal, and peritoneal cancers. Having family members with these cancers may be  associated with an increased risk for harmful changes (mutations) in the breast cancer genes BRCA1 and BRCA2. Results of the assessment will determine the need for genetic counseling and BRCA1 and BRCA2 testing.  The Pap test is a screening test for cervical cancer. Women should have a Pap test starting at age 60. Between ages 36 and 63, Pap tests should be repeated every 2 years. Beginning at age 37, you should have a Pap test every 3 years as long as the past 3 Pap tests have been normal. If you had a hysterectomy for a problem that was not cancer or a condition that could lead to cancer, then you no longer need Pap tests. If you are between ages 40 and 33, and you have had normal Pap tests going back 10 years, you no longer need Pap tests. If you have had past treatment for cervical cancer or a condition that could lead to cancer, you need Pap tests and screening for cancer for at least 20 years after your treatment. If Pap tests have been discontinued, risk factors (such as a new sexual  partner) need to be reassessed to determine if screening should be resumed. Some women have medical problems that increase the chance of getting cervical cancer. In these cases, your caregiver may recommend more frequent screening and Pap tests.  The human papillomavirus (HPV) test is an additional test that may be used for cervical cancer screening. The HPV test looks for the virus that can cause the cell changes on the cervix. The cells collected during the Pap test can be tested for HPV. The HPV test could be used to screen women aged 28 years and older, and should be used in women of any age who have unclear Pap test results. After the age of 65, women should have HPV testing at the same frequency as a Pap test.  Colorectal cancer can be detected and often prevented. Most routine colorectal cancer screening begins at the age of 52 and continues through age 66. However, your caregiver may recommend screening at an  earlier age if you have risk factors for colon cancer. On a yearly basis, your caregiver may provide home test kits to check for hidden blood in the stool. Use of a small camera at the end of a tube, to directly examine the colon (sigmoidoscopy or colonoscopy), can detect the earliest forms of colorectal cancer. Talk to your caregiver about this at age 3, when routine screening begins. Direct examination of the colon should be repeated every 5 to 10 years through age 29, unless early forms of pre-cancerous polyps or small growths are found.  Hepatitis C blood testing is recommended for all people born from [birth year] through 1967 and any individual with known risks for hepatitis C.  Practice safe sex. Use condoms and avoid high-risk sexual practices to reduce the spread of sexually transmitted infections (STIs). Sexually active women aged 57 and younger should be checked for Chlamydia, which is a common sexually transmitted infection. Older women with new or multiple partners should also be tested for Chlamydia. Testing for other STIs is recommended if you are sexually active and at increased risk.  Osteoporosis is a disease in which the bones lose minerals and strength with aging. This can result in serious bone fractures. The risk of osteoporosis can be identified using a bone density scan. Women ages 88 and over and women at risk for fractures or osteoporosis should discuss screening with their caregivers. Ask your caregiver whether you should be taking a calcium supplement or vitamin D to reduce the rate of osteoporosis.  Menopause can be associated with physical symptoms and risks. Hormone replacement therapy is available to decrease symptoms and risks. You should talk to your caregiver about whether hormone replacement therapy is right for you.  Use sunscreen. Apply sunscreen liberally and repeatedly throughout the day. You should seek shade when your shadow is shorter than you. Protect yourself by  wearing long sleeves, pants, a wide-brimmed hat, and sunglasses year round, whenever you are outdoors.  Notify your caregiver of new moles or changes in moles, especially if there is a change in shape or color. Also notify your caregiver if a mole is larger than the size of a pencil eraser.  Stay current with your immunizations. Document Released: 04/05/2011 Document Revised: 01/15/2013 Document Reviewed: 04/05/2011 [facility] Patient Information 2014 [place].

## 2013-11-19 NOTE — Progress Notes (Signed)
Subjective:    Patient ID: [name], female    DOB: 1961-01-27, 53 y.o.   MRN: [id number]  HPI  Patient here for annual physical.  Chronic medical issues also reviewed.    HTN - on diuretic and beta-blocker (which helps control palpitation symptoms) - no chest pain, shortness of breath, lower extremity edema, orthopnea.  Working on low carbohydrate diet and increasing exercise.   LBP/sciatica - all pain symptoms resolved following 09/2010 fusion L5-S1  Migraines - chronic - worse with allergy exacerbation.  Pt denies need for RX medications at this time.  Maintained with OTC treatment.   Past Medical History  Diagnosis Date  . DIVERTICULITIS, HX OF   . OSTEOPENIA   . MENOPAUSAL SYNDROME   . MIGRAINE UNSP W/O INTRACT W/O STATUS MIGRAINOSUS   . HYPERTENSION   . SCIATICA, LEFT     resolved s/p l5-s1 fusion 09/2010  . ANEMIA-NOS   . Chronic migraine    Family History  Problem Relation Age of Onset  . Arthritis Paternal Grandmother   . Diabetes Paternal Grandmother   . Hypothyroidism Maternal Grandmother   . Coronary artery disease Father   . Hypertension Mother   . Coronary artery disease Maternal Grandmother   . Coronary artery disease Paternal Grandmother   . Hypertension Father   . Parkinson's disease Father   . Kidney cancer Brother     RCC s/p nephrectomy   History  Substance Use Topics  . Smoking status: Former Smoker -- 1.00 packs/day for 15 years    Types: Cigarettes    Quit date: 10/04/1992  . Smokeless tobacco: Not on file  . Alcohol Use: Yes     Comment: glass wine per month     Review of Systems  Constitutional: Negative for fever, chills, activity change and appetite change.  HENT: Negative for congestion, postnasal drip, rhinorrhea, sinus pressure and sore throat.   Eyes: Negative for discharge and itching.  Respiratory: Negative for cough, chest tightness, shortness of breath and wheezing.   Cardiovascular: Negative for chest pain,  palpitations and leg swelling.  Gastrointestinal: Negative for nausea, vomiting, diarrhea and constipation.  Endocrine: Negative for cold intolerance, heat intolerance, polydipsia, polyphagia and polyuria.  Musculoskeletal: Negative for arthralgias and myalgias.  Skin: Negative for rash and wound.  Neurological: Positive for headaches (chronic). Negative for dizziness, syncope and weakness.  Hematological: Negative for adenopathy.  Psychiatric/Behavioral: Negative for sleep disturbance and dysphoric mood. The patient is not nervous/anxious.   All other systems reviewed and are negative.       Objective:   Physical Exam  Constitutional: She is oriented to person, place, and time. She appears well-developed and well-nourished. No distress.  HENT:  Head: Normocephalic and atraumatic.  Right Ear: External ear normal.  Left Ear: External ear normal.  Nose: Nose normal.  Mouth/Throat: Oropharynx is clear and moist. No oropharyngeal exudate.  Eyes: Conjunctivae and EOM are normal. Pupils are equal, round, and reactive to light. Right eye exhibits no discharge. Left eye exhibits no discharge.  Neck: Normal range of motion. Neck supple. No thyromegaly present.  Cardiovascular: Normal rate, regular rhythm and normal heart sounds.   Pulmonary/Chest: Effort normal and breath sounds normal. No respiratory distress. She has no wheezes.  Abdominal: Soft. Bowel sounds are normal. She exhibits no distension and no mass. There is no rebound and no guarding.  Musculoskeletal: Normal range of motion. She exhibits no edema and no tenderness.  Lymphadenopathy:    She has no cervical adenopathy.  Neurological: She is alert and oriented to person, place, and time.  Skin: Skin is warm and dry. No rash noted. She is not diaphoretic.  Psychiatric: She has a normal mood and affect. Her behavior is normal. Judgment and thought content normal.    Wt Readings from Last 3 Encounters:  11/19/13 138 lb 1.9 oz  (62.651 kg)  09/07/12 140 lb (63.504 kg)  08/09/12 142 lb (64.411 kg)   BP Readings from Last 3 Encounters:  11/19/13 122/80  08/09/12 112/70  07/13/12 118/80   Lab Results  Component Value Date   WBC 7.6 07/13/2012   HGB 14.0 07/13/2012   HCT 42.4 07/13/2012   PLT 294.0 07/13/2012   GLUCOSE 101* 07/13/2012   CHOL 161 07/13/2012   TRIG 114.0 07/13/2012   HDL 45.50 07/13/2012   LDLDIRECT 158.5 05/05/2011   LDLCALC 93 07/13/2012   ALT 51* 07/13/2012   AST 41* 07/13/2012   NA 138 07/13/2012   K 4.4 07/13/2012   CL 104 07/13/2012   CREATININE 0.7 07/13/2012   BUN 15 07/13/2012   CO2 28 07/13/2012   TSH 0.96 07/13/2012   INR 0.95 09/11/2010   EKG - normal sinus rhythm, rate 68, normal intervals - unchanged from 02/2011      Assessment & Plan:  CPX - v70.0 - Patient has been counseled on age-appropriate routine health concerns for screening and prevention. These are reviewed and up-to-date. Immunizations are up-to-date or declined. Labs ordered and ECG reviewed.   Also See problem list. Medications and labs reviewed today.

## 2013-11-19 NOTE — Assessment & Plan Note (Signed)
BP Readings from Last 3 Encounters:  11/19/13 122/80  08/09/12 112/70  07/13/12 118/80   Reduced beta blocker August 2012 due to low blood pressure and fatigue  Changed to extended release in 2013 Continue diuretic as needed

## 2013-11-19 NOTE — Progress Notes (Signed)
Pre-visit discussion using our clinic review tool. No additional management support is needed unless otherwise documented below in the visit note.  

## 2013-11-20 ENCOUNTER — Encounter: Payer: Self-pay | Admitting: Internal Medicine

## 2013-11-21 ENCOUNTER — Encounter: Payer: Self-pay | Admitting: Internal Medicine

## 2013-12-03 ENCOUNTER — Other Ambulatory Visit: Payer: Self-pay | Admitting: Internal Medicine

## 2013-12-05 LAB — HM COLONOSCOPY: HM Colonoscopy: NEGATIVE

## 2013-12-21 ENCOUNTER — Encounter: Payer: Self-pay | Admitting: Internal Medicine

## 2014-01-07 ENCOUNTER — Encounter: Payer: Self-pay | Admitting: Internal Medicine

## 2014-04-17 ENCOUNTER — Other Ambulatory Visit: Payer: Self-pay | Admitting: Internal Medicine

## 2014-09-15 ENCOUNTER — Encounter: Payer: Self-pay | Admitting: Internal Medicine

## 2015-01-09 ENCOUNTER — Other Ambulatory Visit: Payer: Self-pay | Admitting: Internal Medicine

## 2015-02-23 ENCOUNTER — Encounter: Payer: Self-pay | Admitting: Internal Medicine

## 2015-02-24 MED ORDER — IMIQUIMOD 5 % EX CREA: TOPICAL_CREAM | CUTANEOUS | Status: DC

## 2015-05-12 ENCOUNTER — Other Ambulatory Visit: Payer: Self-pay | Admitting: Internal Medicine

## 2015-06-12 ENCOUNTER — Ambulatory Visit (INDEPENDENT_AMBULATORY_CARE_PROVIDER_SITE_OTHER): Payer: 59 | Admitting: Internal Medicine

## 2015-06-12 ENCOUNTER — Encounter: Payer: Self-pay | Admitting: Internal Medicine

## 2015-06-12 ENCOUNTER — Other Ambulatory Visit (INDEPENDENT_AMBULATORY_CARE_PROVIDER_SITE_OTHER): Payer: 59

## 2015-06-12 VITALS — BP 116/74 | HR 75 | Temp 98.4°F | Ht 63.0 in | Wt 144.5 lb

## 2015-06-12 DIAGNOSIS — Z1159 Encounter for screening for other viral diseases: Secondary | ICD-10-CM

## 2015-06-12 DIAGNOSIS — R7989 Other specified abnormal findings of blood chemistry: Secondary | ICD-10-CM

## 2015-06-12 DIAGNOSIS — M8580 Other specified disorders of bone density and structure, unspecified site: Secondary | ICD-10-CM

## 2015-06-12 DIAGNOSIS — Z114 Encounter for screening for human immunodeficiency virus [HIV]: Secondary | ICD-10-CM

## 2015-06-12 DIAGNOSIS — R945 Abnormal results of liver function studies: Secondary | ICD-10-CM

## 2015-06-12 DIAGNOSIS — Z1239 Encounter for other screening for malignant neoplasm of breast: Secondary | ICD-10-CM

## 2015-06-12 DIAGNOSIS — Z0000 Encounter for general adult medical examination without abnormal findings: Secondary | ICD-10-CM

## 2015-06-12 LAB — CBC WITH DIFFERENTIAL/PLATELET
Basophils Absolute: 0 10*3/uL (ref 0.0–0.1)
Basophils Relative: 0.5 % (ref 0.0–3.0)
EOS PCT: 3.8 % (ref 0.0–5.0)
Eosinophils Absolute: 0.3 10*3/uL (ref 0.0–0.7)
HCT: 41.9 % (ref 36.0–46.0)
Hemoglobin: 14.2 g/dL (ref 12.0–15.0)
LYMPHS ABS: 2.4 10*3/uL (ref 0.7–4.0)
Lymphocytes Relative: 31 % (ref 12.0–46.0)
MCHC: 33.9 g/dL (ref 30.0–36.0)
MCV: 88.3 fl (ref 78.0–100.0)
MONOS PCT: 8.3 % (ref 3.0–12.0)
Monocytes Absolute: 0.6 10*3/uL (ref 0.1–1.0)
NEUTROS ABS: 4.3 10*3/uL (ref 1.4–7.7)
NEUTROS PCT: 56.4 % (ref 43.0–77.0)
PLATELETS: 292 10*3/uL (ref 150.0–400.0)
RBC: 4.75 Mil/uL (ref 3.87–5.11)
RDW: 12.9 % (ref 11.5–15.5)
WBC: 7.7 10*3/uL (ref 4.0–10.5)

## 2015-06-12 LAB — BASIC METABOLIC PANEL
BUN: 10 mg/dL (ref 6–23)
CO2: 27 meq/L (ref 19–32)
Calcium: 9.8 mg/dL (ref 8.4–10.5)
Chloride: 101 mEq/L (ref 96–112)
Creatinine, Ser: 0.71 mg/dL (ref 0.40–1.20)
GFR: 90.35 mL/min (ref >60.00)
GLUCOSE: 102 mg/dL — AB (ref 70–99)
POTASSIUM: 4.2 meq/L (ref 3.5–5.1)
SODIUM: 138 meq/L (ref 135–145)

## 2015-06-12 LAB — URINALYSIS, ROUTINE W REFLEX MICROSCOPIC
Bilirubin Urine: NEGATIVE
KETONES UR: NEGATIVE
NITRITE: NEGATIVE
PH: 7 (ref 5.0–8.0)
TOTAL PROTEIN, URINE-UPE24: NEGATIVE
URINE GLUCOSE: NEGATIVE
UROBILINOGEN UA: 0.2 (ref 0.0–1.0)

## 2015-06-12 LAB — HEPATITIS C ANTIBODY: HCV AB: NEGATIVE

## 2015-06-12 LAB — HEPATIC FUNCTION PANEL
ALBUMIN: 4.4 g/dL (ref 3.5–5.2)
ALT: 64 U/L — ABNORMAL HIGH (ref 0–35)
AST: 43 U/L — AB (ref 0–37)
Alkaline Phosphatase: 156 U/L — ABNORMAL HIGH (ref 39–117)
Bilirubin, Direct: 0.1 mg/dL (ref 0.0–0.3)
Total Bilirubin: 0.4 mg/dL (ref 0.2–1.2)
Total Protein: 7.6 g/dL (ref 6.0–8.3)

## 2015-06-12 LAB — LIPID PANEL
CHOLESTEROL: 167 mg/dL (ref 0–200)
HDL: 51.4 mg/dL (ref >39.00)
LDL Cholesterol: 93 mg/dL (ref 0–99)
NonHDL: 115.98
Total CHOL/HDL Ratio: 3
Triglycerides: 116 mg/dL (ref 0.0–149.0)
VLDL: 23.2 mg/dL (ref 0.0–40.0)

## 2015-06-12 LAB — HIV ANTIBODY (ROUTINE TESTING W REFLEX): HIV 1&2 Ab, 4th Generation: NONREACTIVE

## 2015-06-12 LAB — TSH: TSH: 0.93 u[IU]/mL (ref 0.35–4.50)

## 2015-06-12 MED ORDER — METOPROLOL SUCCINATE ER 25 MG PO TB24: 25.0000 mg | ORAL_TABLET | Freq: Every day | ORAL | Status: DC

## 2015-06-12 MED ORDER — SIMVASTATIN 20 MG PO TABS: 20.0000 mg | ORAL_TABLET | Freq: Every evening | ORAL | Status: DC

## 2015-06-12 MED ORDER — HYDROCHLOROTHIAZIDE 12.5 MG PO CAPS: 12.5000 mg | ORAL_CAPSULE | Freq: Every day | ORAL | Status: DC

## 2015-06-12 MED ORDER — IMIQUIMOD 5 % EX CREA: TOPICAL_CREAM | CUTANEOUS | Status: DC

## 2015-06-12 NOTE — Progress Notes (Signed)
Subjective:    Patient ID: [name], female    DOB: [birth year], 54 y.o.   MRN: [id number]  HPI  patient is here today for annual physical. Patient feels well and has no complaints. Also reviewed chronic medical conditions, interval events and current concerns  Past Medical History  Diagnosis Date  . DIVERTICULITIS, HX OF   . OSTEOPENIA   . MENOPAUSAL SYNDROME   . HYPERTENSION   . SCIATICA, LEFT     resolved s/p l5-s1 fusion 09/2010  . ANEMIA-NOS   . Chronic migraine     improved w/ "LBS" herbal  . Shingles outbreak 1997    R thoracic outbreak   Family History  Problem Relation Age of Onset  . Arthritis Paternal Grandmother   . Diabetes Paternal Grandmother   . Hypothyroidism Maternal Grandmother   . Coronary artery disease Father   . Hypertension Mother   . Coronary artery disease Maternal Grandmother   . Coronary artery disease Paternal Grandmother   . Hypertension Father   . Parkinson's disease Father   . Kidney cancer Brother     RCC s/p nephrectomy  . Alzheimer's disease Maternal Grandmother 55  . Hypothyroidism Mother   . Heart block Brother     PPM in 20's   Social History  Substance Use Topics  . Smoking status: Former Smoker -- 1.00 packs/day for 15 years    Types: Cigarettes    Quit date: 10/04/1992  . Smokeless tobacco: None  . Alcohol Use: Yes     Comment: glass wine per month    Review of Systems  Constitutional: Negative for fatigue and unexpected weight change.  Respiratory: Negative for cough, shortness of breath and wheezing.   Cardiovascular: Negative for chest pain, palpitations and leg swelling.  Gastrointestinal: Negative for nausea, abdominal pain and diarrhea.  Neurological: Negative for dizziness, weakness, light-headedness and headaches.  Psychiatric/Behavioral: Negative for dysphoric mood. The patient is not nervous/anxious.   All other systems reviewed and are negative.      Objective:    Physical Exam    Constitutional: She is oriented to person, place, and time. She appears well-developed and well-nourished. No distress.  HENT:  Head: Normocephalic and atraumatic.  Right Ear: External ear normal.  Left Ear: External ear normal.  Nose: Nose normal.  Mouth/Throat: Oropharynx is clear and moist. No oropharyngeal exudate.  Eyes: EOM are normal. Pupils are equal, round, and reactive to light. Right eye exhibits no discharge. Left eye exhibits no discharge. No scleral icterus.  Neck: Normal range of motion. Neck supple. No JVD present. No tracheal deviation present. No thyromegaly present.  Cardiovascular: Normal rate, regular rhythm, normal heart sounds and intact distal pulses.  Exam reveals no friction rub.   No murmur heard. Pulmonary/Chest: Effort normal and breath sounds normal. No respiratory distress. She has no wheezes. She has no rales. She exhibits no tenderness.  Abdominal: Soft. Bowel sounds are normal. She exhibits no distension and no mass. There is no tenderness. There is no rebound and no guarding.  Genitourinary:  Defer to gyn  Musculoskeletal: Normal range of motion.  No gross deformities  Lymphadenopathy:    She has no cervical adenopathy.  Neurological: She is alert and oriented to person, place, and time. She has normal reflexes. No cranial nerve deficit.  Skin: Skin is warm and dry. No rash noted. She is not diaphoretic. No erythema.  Psychiatric: She has a normal mood and affect. Her behavior is normal. Judgment and thought content normal.  Nursing note and vitals reviewed.   BP 116/74 mmHg  Pulse 75  Temp(Src) 98.4 F (36.9 C) (Oral)  Ht 5\' 3"  (1.6 m)  Wt 144 lb 8 oz (65.545 kg)  BMI 25.60 kg/m2  SpO2 95% Wt Readings from Last 3 Encounters:  06/12/15 144 lb 8 oz (65.545 kg)  11/19/13 138 lb 1.9 oz (62.651 kg)  09/07/12 140 lb (63.504 kg)    Lab Results  Component Value Date   WBC 8.0 11/19/2013   HGB 14.3 11/19/2013   HCT 43.3 11/19/2013   PLT 298.0  11/19/2013   GLUCOSE 87 11/19/2013   CHOL 190 11/19/2013   TRIG 144.0 11/19/2013   HDL 51.80 11/19/2013   LDLDIRECT 158.5 05/05/2011   LDLCALC 109* 11/19/2013   ALT 65* 11/19/2013   AST 45* 11/19/2013   NA 143 11/19/2013   K 4.6 11/19/2013   CL 106 11/19/2013   CREATININE 0.7 11/19/2013   BUN 17 11/19/2013   CO2 27 11/19/2013   TSH 1.01 11/19/2013   INR 0.95 09/11/2010    No results found.     Assessment & Plan:   CPX/z00.00 - Patient has been counseled on age-appropriate routine health concerns for screening and prevention. These are reviewed and up-to-date. Immunizations are up-to-date or declined. Labs ordered and reviewed.  Problem List Items Addressed This Visit    Osteopenia   Relevant Orders   DG Bone Density    Other Visit Diagnoses    Routine general medical examination at a health care facility    -  Primary    Relevant Orders    Basic metabolic panel    CBC with Differential/Platelet    Hepatic function panel    Lipid panel    TSH    Urinalysis, Routine w reflex microscopic (not at [facility])    Screening for breast cancer        Relevant Orders    MM DIGITAL SCREENING BILATERAL    Need for hepatitis C screening test        Relevant Orders    Hepatitis C antibody    Screening for HIV without presence of risk factors        Relevant Orders    HIV antibody        Rene Paci, MD

## 2015-06-12 NOTE — Progress Notes (Signed)
Pre visit review using our clinic review tool, if applicable. No additional management support is needed unless otherwise documented below in the visit note. 

## 2015-06-12 NOTE — Patient Instructions (Addendum)
It was good to see you today.  We have reviewed your prior records including labs and tests today  Health Maintenance reviewed - will refer for mammo and DEXA bone density - all other recommended immunizations and age-appropriate screenings are up-to-date.  Test(s) ordered today. Your results will be released to [facility] (or called to you) after review, usually within 72hours after test completion. If any changes need to be made, you will be notified at that same time.  Medications reviewed and updated, no changes recommended at this time.  Please schedule followup in 12 months for annual exam and labs, call sooner if problems.  Health Maintenance Adopting a healthy lifestyle and getting preventive care can go a long way to promote health and wellness. Talk with your health care provider about what schedule of regular examinations is right for you. This is a good chance for you to check in with your provider about disease prevention and staying healthy. In between checkups, there are plenty of things you can do on your own. Experts have done a lot of research about which lifestyle changes and preventive measures are most likely to keep you healthy. Ask your health care provider for more information. WEIGHT AND DIET  Eat a healthy diet  Be sure to include plenty of vegetables, fruits, low-fat dairy products, and lean protein.  Do not eat a lot of foods high in solid fats, added sugars, or salt.  Get regular exercise. This is one of the most important things you can do for your health.  Most adults should exercise for at least 150 minutes each week. The exercise should increase your heart rate and make you sweat (moderate-intensity exercise).  Most adults should also do strengthening exercises at least twice a week. This is in addition to the moderate-intensity exercise.  Maintain a healthy weight  Body mass index (BMI) is a measurement that can be used to identify possible weight  problems. It estimates body fat based on height and weight. Your health care provider can help determine your BMI and help you achieve or maintain a healthy weight.  For females 65 years of age and older:   A BMI below 18.5 is considered underweight.  A BMI of 18.5 to 24.9 is normal.  A BMI of 25 to 29.9 is considered overweight.  A BMI of 30 and above is considered obese.  Watch levels of cholesterol and blood lipids  You should start having your blood tested for lipids and cholesterol at 56 years of age, then have this test every 5 years.  You may need to have your cholesterol levels checked more often if:  Your lipid or cholesterol levels are high.  You are older than 56 years of age.  You are at high risk for heart disease.  CANCER SCREENING   Lung Cancer  Lung cancer screening is recommended for adults 69-71 years old who are at high risk for lung cancer because of a history of smoking.  A yearly low-dose CT scan of the lungs is recommended for people who:  Currently smoke.  Have quit within the past 15 years.  Have at least a 30-pack-year history of smoking. A pack year is smoking an average of one pack of cigarettes a day for 1 year.  Yearly screening should continue until it has been 15 years since you quit.  Yearly screening should stop if you develop a health problem that would prevent you from having lung cancer treatment.  Breast Cancer  Practice breast  self-awareness. This means understanding how your breasts normally appear and feel.  It also means doing regular breast self-exams. Let your health care provider know about any changes, no matter how small.  If you are in your 20s or 30s, you should have a clinical breast exam (CBE) by a health care provider every 1-3 years as part of a regular health exam.  If you are 40 or older, have a CBE every year. Also consider having a breast X-ray (mammogram) every year.  If you have a family history of breast  cancer, talk to your health care provider about genetic screening.  If you are at high risk for breast cancer, talk to your health care provider about having an MRI and a mammogram every year.  Breast cancer gene (BRCA) assessment is recommended for women who have family members with BRCA-related cancers. BRCA-related cancers include:  Breast.  Ovarian.  Tubal.  Peritoneal cancers.  Results of the assessment will determine the need for genetic counseling and BRCA1 and BRCA2 testing. Cervical Cancer Routine pelvic examinations to screen for cervical cancer are no longer recommended for nonpregnant women who are considered low risk for cancer of the pelvic organs (ovaries, uterus, and vagina) and who do not have symptoms. A pelvic examination may be necessary if you have symptoms including those associated with pelvic infections. Ask your health care provider if a screening pelvic exam is right for you.   The Pap test is the screening test for cervical cancer for women who are considered at risk.  If you had a hysterectomy for a problem that was not cancer or a condition that could lead to cancer, then you no longer need Pap tests.  If you are older than 65 years, and you have had normal Pap tests for the past 10 years, you no longer need to have Pap tests.  If you have had past treatment for cervical cancer or a condition that could lead to cancer, you need Pap tests and screening for cancer for at least 20 years after your treatment.  If you no longer get a Pap test, assess your risk factors if they change (such as having a new sexual partner). This can affect whether you should start being screened again.  Some women have medical problems that increase their chance of getting cervical cancer. If this is the case for you, your health care provider may recommend more frequent screening and Pap tests.  The human papillomavirus (HPV) test is another test that may be used for cervical  cancer screening. The HPV test looks for the virus that can cause cell changes in the cervix. The cells collected during the Pap test can be tested for HPV.  The HPV test can be used to screen women 77 years of age and older. Getting tested for HPV can extend the interval between normal Pap tests from three to five years.  An HPV test also should be used to screen women of any age who have unclear Pap test results.  After 55 years of age, women should have HPV testing as often as Pap tests.  Colorectal Cancer  This type of cancer can be detected and often prevented.  Routine colorectal cancer screening usually begins at 56 years of age and continues through 56 years of age.  Your health care provider may recommend screening at an earlier age if you have risk factors for colon cancer.  Your health care provider may also recommend using home test kits to check  for hidden blood in the stool.  A small camera at the end of a tube can be used to examine your colon directly (sigmoidoscopy or colonoscopy). This is done to check for the earliest forms of colorectal cancer.  Routine screening usually begins at age 7.  Direct examination of the colon should be repeated every 5-10 years through 56 years of age. However, you may need to be screened more often if early forms of precancerous polyps or small growths are found. Skin Cancer  Check your skin from head to toe regularly.  Tell your health care provider about any new moles or changes in moles, especially if there is a change in a mole's shape or color.  Also tell your health care provider if you have a mole that is larger than the size of a pencil eraser.  Always use sunscreen. Apply sunscreen liberally and repeatedly throughout the day.  Protect yourself by wearing long sleeves, pants, a wide-brimmed hat, and sunglasses whenever you are outside. HEART DISEASE, DIABETES, AND HIGH BLOOD PRESSURE   Have your blood pressure checked at  least every 1-2 years. High blood pressure causes heart disease and increases the risk of stroke.  If you are between 64 years and 85 years old, ask your health care provider if you should take aspirin to prevent strokes.  Have regular diabetes screenings. This involves taking a blood sample to check your fasting blood sugar level.  If you are at a normal weight and have a low risk for diabetes, have this test once every three years after 56 years of age.  If you are overweight and have a high risk for diabetes, consider being tested at a younger age or more often. PREVENTING INFECTION  Hepatitis B  If you have a higher risk for hepatitis B, you should be screened for this virus. You are considered at high risk for hepatitis B if:  You were born in a country where hepatitis B is common. Ask your health care provider which countries are considered high risk.  Your parents were born in a high-risk country, and you have not been immunized against hepatitis B (hepatitis B vaccine).  You have HIV or AIDS.  You use needles to inject street drugs.  You live with someone who has hepatitis B.  You have had sex with someone who has hepatitis B.  You get hemodialysis treatment.  You take certain medicines for conditions, including cancer, organ transplantation, and autoimmune conditions. Hepatitis C  Blood testing is recommended for:  Everyone born from [birth year] through 1967.  Anyone with known risk factors for hepatitis C. Sexually transmitted infections (STIs)  You should be screened for sexually transmitted infections (STIs) including gonorrhea and chlamydia if:  You are sexually active and are younger than 56 years of age.  You are older than 56 years of age and your health care provider tells you that you are at risk for this type of infection.  Your sexual activity has changed since you were last screened and you are at an increased risk for chlamydia or gonorrhea. Ask your health  care provider if you are at risk.  If you do not have HIV, but are at risk, it may be recommended that you take a prescription medicine daily to prevent HIV infection. This is called pre-exposure prophylaxis (PrEP). You are considered at risk if:  You are sexually active and do not regularly use condoms or know the HIV status of your partner(s).  You take drugs  by injection.  You are sexually active with a partner who has HIV. Talk with your health care provider about whether you are at high risk of being infected with HIV. If you choose to begin PrEP, you should first be tested for HIV. You should then be tested every 3 months for as long as you are taking PrEP.  PREGNANCY   If you are premenopausal and you may become pregnant, ask your health care provider about preconception counseling.  If you may become pregnant, take 400 to 800 micrograms (mcg) of folic acid every day.  If you want to prevent pregnancy, talk to your health care provider about birth control (contraception). OSTEOPOROSIS AND MENOPAUSE   Osteoporosis is a disease in which the bones lose minerals and strength with aging. This can result in serious bone fractures. Your risk for osteoporosis can be identified using a bone density scan.  If you are 50 years of age or older, or if you are at risk for osteoporosis and fractures, ask your health care provider if you should be screened.  Ask your health care provider whether you should take a calcium or vitamin D supplement to lower your risk for osteoporosis.  Menopause may have certain physical symptoms and risks.  Hormone replacement therapy may reduce some of these symptoms and risks. Talk to your health care provider about whether hormone replacement therapy is right for you.  HOME CARE INSTRUCTIONS   Schedule regular health, dental, and eye exams.  Stay current with your immunizations.   Do not use any tobacco products including cigarettes, chewing tobacco, or  electronic cigarettes.  If you are pregnant, do not drink alcohol.  If you are breastfeeding, limit how much and how often you drink alcohol.  Limit alcohol intake to no more than 1 drink per day for nonpregnant women. One drink equals 12 ounces of beer, 5 ounces of wine, or 1 ounces of hard liquor.  Do not use street drugs.  Do not share needles.  Ask your health care provider for help if you need support or information about quitting drugs.  Tell your health care provider if you often feel depressed.  Tell your health care provider if you have ever been abused or do not feel safe at home. Document Released: 04/05/2011 Document Revised: 02/04/2014 Document Reviewed: 08/22/2013 [facility] Patient Information 2015 [place], [place]. This information is not intended to replace advice given to you by your health care provider. Make sure you discuss any questions you have with your health care provider.

## 2015-06-13 ENCOUNTER — Encounter: Payer: Self-pay | Admitting: Internal Medicine

## 2015-06-13 DIAGNOSIS — R7989 Other specified abnormal findings of blood chemistry: Secondary | ICD-10-CM | POA: Insufficient documentation

## 2015-06-13 DIAGNOSIS — R945 Abnormal results of liver function studies: Secondary | ICD-10-CM | POA: Insufficient documentation

## 2015-06-13 NOTE — Addendum Note (Signed)
Addended by: Rene Paci A on: 06/13/2015 10:31 AM   Modules accepted: Orders, SmartSet

## 2015-06-13 NOTE — Addendum Note (Signed)
Addended by: Rene Paci A on: 06/13/2015 10:32 AM   Modules accepted: Kipp Brood

## 2015-08-21 ENCOUNTER — Encounter: Payer: Self-pay | Admitting: Internal Medicine

## 2015-10-20 MED FILL — METOPROLOL SUCC ER 25 MG TA: 25 | 90 days supply | Qty: 90 | Fill #0

## 2015-10-20 MED FILL — SIMVASTATIN 20 MG TABLET: 20 | 90 days supply | Qty: 90 | Fill #3

## 2015-11-28 MED FILL — HYDROCHLOROTHIAZIDE 12.5 MG: 12.5 | 60 days supply | Qty: 60 | Fill #4

## 2016-01-06 DIAGNOSIS — Z780 Asymptomatic menopausal state: Secondary | ICD-10-CM | POA: Diagnosis not present

## 2016-01-06 DIAGNOSIS — M810 Age-related osteoporosis without current pathological fracture: Secondary | ICD-10-CM | POA: Diagnosis not present

## 2016-01-06 DIAGNOSIS — Z1231 Encounter for screening mammogram for malignant neoplasm of breast: Secondary | ICD-10-CM | POA: Diagnosis not present

## 2016-01-06 LAB — HM MAMMOGRAPHY

## 2016-01-06 LAB — HM DEXA SCAN

## 2016-01-09 DIAGNOSIS — R928 Other abnormal and inconclusive findings on diagnostic imaging of breast: Secondary | ICD-10-CM | POA: Diagnosis not present

## 2016-01-09 DIAGNOSIS — R922 Inconclusive mammogram: Secondary | ICD-10-CM | POA: Diagnosis not present

## 2016-01-11 ENCOUNTER — Encounter: Payer: Self-pay | Admitting: Internal Medicine

## 2016-01-14 ENCOUNTER — Other Ambulatory Visit: Payer: Self-pay | Admitting: Radiology

## 2016-01-14 DIAGNOSIS — N63 Unspecified lump in breast: Secondary | ICD-10-CM | POA: Diagnosis not present

## 2016-01-14 DIAGNOSIS — N6012 Diffuse cystic mastopathy of left breast: Secondary | ICD-10-CM | POA: Diagnosis not present

## 2016-01-19 ENCOUNTER — Encounter: Payer: Self-pay | Admitting: Internal Medicine

## 2016-01-26 ENCOUNTER — Encounter: Payer: Self-pay | Admitting: Internal Medicine

## 2016-02-02 ENCOUNTER — Encounter: Payer: Self-pay | Admitting: Internal Medicine

## 2016-02-16 MED FILL — METOPROLOL SUCC ER 25 MG TA: 25 | 90 days supply | Qty: 90 | Fill #1

## 2016-02-16 MED FILL — HYDROCHLOROTHIAZIDE 12.5 MG: 12.5 | 90 days supply | Qty: 90 | Fill #0

## 2016-02-16 MED FILL — SIMVASTATIN 20 MG TABLET: 20 | 90 days supply | Qty: 90 | Fill #0

## 2016-03-09 ENCOUNTER — Encounter: Payer: Self-pay | Admitting: Internal Medicine

## 2016-03-09 ENCOUNTER — Other Ambulatory Visit (INDEPENDENT_AMBULATORY_CARE_PROVIDER_SITE_OTHER): Payer: 59

## 2016-03-09 ENCOUNTER — Ambulatory Visit (INDEPENDENT_AMBULATORY_CARE_PROVIDER_SITE_OTHER): Payer: 59 | Admitting: Internal Medicine

## 2016-03-09 VITALS — BP 118/80 | HR 70 | Temp 98.5°F | Resp 16 | Wt 140.0 lb

## 2016-03-09 DIAGNOSIS — G43109 Migraine with aura, not intractable, without status migrainosus: Secondary | ICD-10-CM

## 2016-03-09 DIAGNOSIS — R7989 Other specified abnormal findings of blood chemistry: Secondary | ICD-10-CM

## 2016-03-09 DIAGNOSIS — M5430 Sciatica, unspecified side: Secondary | ICD-10-CM | POA: Diagnosis not present

## 2016-03-09 DIAGNOSIS — I10 Essential (primary) hypertension: Secondary | ICD-10-CM

## 2016-03-09 DIAGNOSIS — R945 Abnormal results of liver function studies: Secondary | ICD-10-CM

## 2016-03-09 DIAGNOSIS — R799 Abnormal finding of blood chemistry, unspecified: Secondary | ICD-10-CM | POA: Diagnosis not present

## 2016-03-09 DIAGNOSIS — M810 Age-related osteoporosis without current pathological fracture: Secondary | ICD-10-CM | POA: Diagnosis not present

## 2016-03-09 DIAGNOSIS — E785 Hyperlipidemia, unspecified: Secondary | ICD-10-CM | POA: Diagnosis not present

## 2016-03-09 LAB — COMPREHENSIVE METABOLIC PANEL
ALT: 65 U/L — ABNORMAL HIGH (ref 0–35)
AST: 33 U/L (ref 0–37)
Albumin: 4.6 g/dL (ref 3.5–5.2)
Alkaline Phosphatase: 159 U/L — ABNORMAL HIGH (ref 39–117)
BUN: 10 mg/dL (ref 6–23)
CALCIUM: 9.7 mg/dL (ref 8.4–10.5)
CHLORIDE: 103 meq/L (ref 96–112)
CO2: 32 meq/L (ref 19–32)
Creatinine, Ser: 0.71 mg/dL (ref 0.40–1.20)
GFR: 90.11 mL/min (ref >60.00)
Glucose, Bld: 116 mg/dL — ABNORMAL HIGH (ref 70–99)
Potassium: 3.7 mEq/L (ref 3.5–5.1)
Sodium: 141 mEq/L (ref 135–145)
Total Bilirubin: 0.4 mg/dL (ref 0.2–1.2)
Total Protein: 7.7 g/dL (ref 6.0–8.3)

## 2016-03-09 NOTE — Assessment & Plan Note (Signed)
Continue Excedrin or maxalt as needed

## 2016-03-09 NOTE — Assessment & Plan Note (Signed)
cmp

## 2016-03-09 NOTE — Assessment & Plan Note (Signed)
Taking vitamin d - will check level No taking calcium - will start 500-600mg  supplementation daily Deferred considering treatment at this time Start regular exercise Consider dexa in 1-2 years

## 2016-03-09 NOTE — Progress Notes (Signed)
Subjective:    Patient ID: [name], female    DOB: July 17, 1961, 55 y.o.   MRN: [id number]  HPI She is here to establish with a new pcp.  She is here for follow up.  Hypertension: She is taking her medication daily. She is compliant with a low sodium diet.  She denies chest pain, palpitations, edema, shortness of breath and regular headaches. She is not exercising regularly.  She does not monitor her blood pressure at home.    Hyperlipidemia: She is taking her medication daily. She is compliant with a low fat/cholesterol diet. She is not exercising regularly. She denies myalgias.   Migraine headaches:  She has migraines about once a month and thinks it may be hormonal.  She believes she is postmenopausal and the migraines are getting better.  She usually tries excedrin and if that does not work she tries Scientist, clinical (histocompatibility and immunogenetics).  The medications both typically work well.    She had back surgery and occasional get sciatica.  She uses tylenol as needed.   Osteoporosis:  She had a recent dexa scan and has osteoporosis.  It has been a while she has had a dexa scan,which showed osteopenia.  She is taking vitamin d daily.  She is not exercising.     Medications and allergies reviewed with patient and updated if appropriate.  Patient Active Problem List   Diagnosis Date Noted  . Abnormal LFTs (liver function tests) 06/13/2015  . Snoring 08/09/2012  . Dyslipidemia 05/07/2011  . MENOPAUSAL SYNDROME 03/27/2010  . ANEMIA-NOS 01/10/2009  . MIGRAINE UNSP W/O INTRACT W/O STATUS MIGRAINOSUS 01/10/2009  . HYPERTENSION 01/10/2009  . SCIATICA, LEFT 01/10/2009  . Osteopenia 01/10/2009  . DIVERTICULITIS, HX OF 01/10/2009    Current Outpatient Prescriptions on File Prior to Visit  Medication Sig Dispense Refill  . aspirin-acetaminophen-caffeine (EXCEDRIN MIGRAINE) 250-250-65 MG per tablet Take 2 tablets by mouth every 6 (six) hours as needed.     . Cholecalciferol (VITAMIN D3) 1000 UNITS CAPS Take by mouth  daily.      . hydrochlorothiazide (MICROZIDE) 12.5 MG capsule Take 1 capsule (12.5 mg total) by mouth daily. 90 capsule 3  . imiquimod (ALDARA) 5 % cream Apply topically 3 (three) times a week. 36 each 0  . metoprolol succinate (TOPROL-XL) 25 MG 24 hr tablet Take 1 tablet (25 mg total) by mouth daily. 90 tablet 3  . simvastatin (ZOCOR) 20 MG tablet Take 1 tablet (20 mg total) by mouth every evening. 90 tablet 3   No current facility-administered medications on file prior to visit.    Past Medical History  Diagnosis Date  . DIVERTICULITIS, HX OF   . OSTEOPENIA   . MENOPAUSAL SYNDROME   . HYPERTENSION   . SCIATICA, LEFT     resolved s/p l5-s1 fusion 09/2010  . ANEMIA-NOS   . Chronic migraine     improved w/ "LBS" herbal  . Shingles outbreak 1997    R thoracic outbreak    Past Surgical History  Procedure Laterality Date  . Appendectomy  2008  . Abdominal hysterectomy  2006  . Bladder tact  2006  . Right l5-s1 laminectomy  02/2009  . Lumbar fusion  09/2010    Social History   Social History  . Marital Status: Single    Spouse Name: N/A  . Number of Children: N/A  . Years of Education: N/A   Social History Main Topics  . Smoking status: Former Smoker -- 1.00 packs/day for 15 years  Types: Cigarettes    Quit date: 10/04/1992  . Smokeless tobacco: Not on file  . Alcohol Use: Yes     Comment: glass wine per month  . Drug Use: No  . Sexual Activity: Not on file   Other Topics Concern  . 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 Arthritis Paternal Grandmother   . Diabetes Paternal Grandmother   . Hypothyroidism Maternal Grandmother   . Coronary artery disease Father   . Hypertension Mother   . Coronary artery disease Maternal Grandmother   . Coronary artery disease Paternal Grandmother   . Hypertension Father   . Parkinson's  disease Father   . Kidney cancer Brother     RCC s/p nephrectomy  . Alzheimer's disease Maternal Grandmother [id number]  . Hypothyroidism Mother   . Heart block Brother     PPM in 20's    Review of Systems  Constitutional: Negative for fever, chills, appetite change and fatigue.  Respiratory: Negative for cough, shortness of breath and wheezing.   Cardiovascular: Negative for chest pain, palpitations and leg swelling.  Gastrointestinal: Negative for abdominal pain.  Musculoskeletal: Negative for myalgias.  Neurological: Negative for dizziness, light-headedness and headaches.       Objective:   Filed Vitals:   03/09/16 1539  BP: 118/80  Pulse: 70  Temp: 98.5 F (36.9 C)  Resp: 16   Filed Weights   03/09/16 1539  Weight: 140 lb (63.504 kg)   Body mass index is 24.81 kg/(m^2).   Physical Exam Constitutional: Appears well-developed and well-nourished. No distress.  Neck: Neck supple. No tracheal deviation present. No thyromegaly present.  No carotid bruit. No cervical adenopathy.   Cardiovascular: Normal rate, regular rhythm and normal heart sounds.   No murmur heard.  No edema Pulmonary/Chest: Effort normal and breath sounds normal. No respiratory distress. No wheezes.         Assessment & Plan:   See Problem List for Assessment and Plan of chronic medical problems.  Follow up annually

## 2016-03-09 NOTE — Assessment & Plan Note (Signed)
Lipid panel well controlled No myalgias Continue simvastatin

## 2016-03-09 NOTE — Assessment & Plan Note (Addendum)
BP well controlled Current regimen effective and well tolerated Continue current medications at current doses cmp  

## 2016-03-09 NOTE — Patient Instructions (Addendum)
  Test(s) ordered today. Your results will be released to MyChart (or called to you) after review, usually within 72hours after test completion. If any changes need to be made, you will be notified at that same time.  All other Health Maintenance issues reviewed.   All recommended immunizations and age-appropriate screenings are up-to-date or discussed.  No immunizations administered today.    Medications reviewed and updated.  No changes recommended at this time.    Please followup in CPE

## 2016-03-12 LAB — VITAMIN D 1,25 DIHYDROXY
Vitamin D 1, 25 (OH)2 Total: 89 pg/mL — ABNORMAL HIGH (ref 18–72)
Vitamin D3 1, 25 (OH)2: 89 pg/mL

## 2016-03-17 ENCOUNTER — Telehealth: Payer: Self-pay

## 2016-03-17 NOTE — Telephone Encounter (Signed)
Patient called about her labs. I informed her of the notes. She understood. Thank you.

## 2016-03-18 ENCOUNTER — Encounter: Payer: Self-pay | Admitting: Emergency Medicine

## 2016-04-15 DIAGNOSIS — H524 Presbyopia: Secondary | ICD-10-CM | POA: Diagnosis not present

## 2016-05-21 MED FILL — HYDROCHLOROTHIAZIDE 12.5 MG: 12.5 | 90 days supply | Qty: 90 | Fill #1

## 2016-05-21 MED FILL — METOPROLOL SUCC ER 25 MG TA: 25 | 90 days supply | Qty: 90 | Fill #2

## 2016-05-21 MED FILL — SIMVASTATIN 20 MG TABLET: 20 | 90 days supply | Qty: 90 | Fill #1

## 2016-09-08 ENCOUNTER — Other Ambulatory Visit: Payer: Self-pay | Admitting: Internal Medicine

## 2016-09-08 MED FILL — SIMVASTATIN 20 MG TABLET: 20 | 90 days supply | Qty: 90 | Fill #0

## 2016-09-08 MED FILL — HYDROCHLOROTHIAZIDE 12.5 MG: 12.5 | 90 days supply | Qty: 90 | Fill #0

## 2016-09-08 MED FILL — METOPROLOL SUCC ER 25 MG TA: 25 | 90 days supply | Qty: 90 | Fill #0

## 2016-11-06 ENCOUNTER — Encounter: Payer: Self-pay | Admitting: Emergency Medicine

## 2017-01-04 MED FILL — HYDROCHLOROTHIAZIDE 12.5 MG: 12.5 | 90 days supply | Qty: 90 | Fill #1

## 2017-01-04 MED FILL — METOPROLOL SUCC ER 25 MG TA: 25 | 90 days supply | Qty: 90 | Fill #1

## 2017-01-07 ENCOUNTER — Encounter: Payer: Self-pay | Admitting: Internal Medicine

## 2017-04-25 MED FILL — SIMVASTATIN 20 MG TABLET: 20 | 90 days supply | Qty: 90 | Fill #1

## 2017-04-28 ENCOUNTER — Other Ambulatory Visit: Payer: Self-pay | Admitting: Emergency Medicine

## 2017-04-28 MED ORDER — HYDROCHLOROTHIAZIDE 12.5 MG PO CAPS: 12.5000 mg | ORAL_CAPSULE | Freq: Every day | ORAL | 0 refills | Status: DC

## 2017-04-28 MED FILL — HYDROCHLOROTHIAZIDE 12.5 MG: 12.5 | 90 days supply | Qty: 90 | Fill #0

## 2017-05-03 ENCOUNTER — Other Ambulatory Visit: Payer: Self-pay | Admitting: Emergency Medicine

## 2017-05-03 MED ORDER — METOPROLOL SUCCINATE ER 25 MG PO TB24: 25.0000 mg | ORAL_TABLET | Freq: Every day | ORAL | 0 refills | Status: DC

## 2017-05-03 MED FILL — METOPROLOL SUCC ER 25 MG TA: 25 | 90 days supply | Qty: 90 | Fill #0

## 2017-06-05 DIAGNOSIS — R739 Hyperglycemia, unspecified: Secondary | ICD-10-CM | POA: Insufficient documentation

## 2017-06-05 NOTE — Progress Notes (Signed)
Subjective:    Patient ID: [name], female    DOB: 1961-03-23, 56 y.o.   MRN: [id number]  HPI The patient is here for follow up.  Hypertension: She is taking her medication daily. She is compliant with a low sodium diet.  She denies chest pain, palpitations, edema, shortness of breath and regular headaches. She is not exercising regularly.  She does not monitor her blood pressure at home.    Hyperlipidemia: She is taking her medication daily. She is compliant with a low fat/cholesterol diet. She is not exercising regularly. She denies myalgias.   Migraine headaches:  She takes either excedrin migraine or maxalt if needed.  She has not used the maxalt in 6 months.  She has used the excedrin migraine a few times.    Hyperglycemia:  She is not exercising regularly.  She is compliant with a low sugar/carb diet.    Food allergy:  She has a food allergy and is trying to figure it out.  She has intermittent lip swelling and itching. She does not take anything for it - it is mild.  She does not want to see an allergist at this time.   Medications and allergies reviewed with patient and updated if appropriate.  Patient Active Problem List   Diagnosis Date Noted  . Hyperglycemia 06/05/2017  . Osteoporosis 03/09/2016  . Abnormal LFTs (liver function tests) 06/13/2015  . Snoring 08/09/2012  . Dyslipidemia 05/07/2011  . Migraine headache 01/10/2009  . Essential hypertension 01/10/2009  . SCIATICA, LEFT 01/10/2009  . DIVERTICULITIS, HX OF 01/10/2009    Current Outpatient Prescriptions on File Prior to Visit  Medication Sig Dispense Refill  . aspirin-acetaminophen-caffeine (EXCEDRIN MIGRAINE) 250-250-65 MG per tablet Take 2 tablets by mouth every 6 (six) hours as needed.     . hydrochlorothiazide (MICROZIDE) 12.5 MG capsule Take 1 capsule (12.5 mg total) by mouth daily. --- Office visit needed for further refills 90 capsule 0  . metoprolol succinate (TOPROL-XL) 25 MG 24 hr tablet  Take 1 tablet (25 mg total) by mouth daily. -- Office visit needed for further refills 90 tablet 0  . rizatriptan (MAXALT) 10 MG tablet Take 10 mg by mouth as needed for migraine. May repeat in 2 hours if needed    . simvastatin (ZOCOR) 20 MG tablet TAKE 1 TABLET BY MOUTH EVERY EVENING. 90 tablet 1   No current facility-administered medications on file prior to visit.     Past Medical History:  Diagnosis Date  . ANEMIA-NOS   . Chronic migraine    improved w/ "LBS" herbal  . DIVERTICULITIS, HX OF   . HYPERTENSION   . MENOPAUSAL SYNDROME   . OSTEOPENIA   . SCIATICA, LEFT    resolved s/p l5-s1 fusion 09/2010  . Shingles outbreak 1997   R thoracic outbreak    Past Surgical History:  Procedure Laterality Date  . ABDOMINAL HYSTERECTOMY  2006   [facility]  . APPENDECTOMY  2008  . bladder Tact  2006  . LUMBAR FUSION  09/2010  . Right L5-S1 laminectomy  02/2009    Social History   Social History  . Marital status: Single    Spouse name: N/A  . Number of children: N/A  . Years of education: N/A   Social History Main Topics  . Smoking status: Former Smoker    Packs/day: 1.00    Years: 15.00    Types: Cigarettes    Quit date: 10/04/1992  . Smokeless tobacco: Not on file  .  Alcohol use Yes     Comment: glass wine per month  . Drug use: No  . Sexual activity: Not on file   Other Topics Concern  . 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 Arthritis Paternal Grandmother   . Diabetes Paternal Grandmother   . Hypothyroidism Maternal Grandmother   . Coronary artery disease Father   . Hypertension Mother   . Coronary artery disease Maternal Grandmother   . Coronary artery disease Paternal Grandmother   . Hypertension Father   . Parkinson's disease Father   . Kidney cancer Brother        RCC s/p nephrectomy  . Alzheimer's disease Maternal  Grandmother 59  . Hypothyroidism Mother   . Heart block Brother        PPM in 20's    Review of Systems  Constitutional: Negative for chills and fever.  Respiratory: Negative for cough, shortness of breath and wheezing.   Cardiovascular: Negative for chest pain, palpitations and leg swelling.  Neurological: Positive for headaches (migraines). Negative for dizziness and light-headedness.       Objective:   Vitals:   06/08/17 0829  BP: 130/84  Pulse: 65  Resp: 16  Temp: 98.4 F (36.9 C)  SpO2: 98%   Wt Readings from Last 3 Encounters:  06/08/17 138 lb (62.6 kg)  03/09/16 140 lb (63.5 kg)  06/12/15 144 lb 8 oz (65.5 kg)   Body mass index is 24.45 kg/m.   Physical Exam    Constitutional: Appears well-developed and well-nourished. No distress.  HENT:  Head: Normocephalic and atraumatic.  Neck: Neck supple. No tracheal deviation present. No thyromegaly present.  No cervical lymphadenopathy Cardiovascular: Normal rate, regular rhythm and normal heart sounds.   No murmur heard. No carotid bruit .  No edema Pulmonary/Chest: Effort normal and breath sounds normal. No respiratory distress. No has no wheezes. No rales.  Skin: Skin is warm and dry. Not diaphoretic.  Psychiatric: Normal mood and affect. Behavior is normal.      Assessment & Plan:    See Problem List for Assessment and Plan of chronic medical problems.

## 2017-06-05 NOTE — Patient Instructions (Addendum)
  Test(s) ordered today. Your results will be released to MyChart (or called to you) after review, usually within 72hours after test completion. If any changes need to be made, you will be notified at that same time.  All other Health Maintenance issues reviewed.   All recommended immunizations and age-appropriate screenings are up-to-date or discussed.  No immunizations administered today.   Medications reviewed and updated.  No changes recommended at this time.   Please followup in one year for a physical   

## 2017-06-08 ENCOUNTER — Other Ambulatory Visit (INDEPENDENT_AMBULATORY_CARE_PROVIDER_SITE_OTHER): Payer: 59

## 2017-06-08 ENCOUNTER — Encounter: Payer: Self-pay | Admitting: Internal Medicine

## 2017-06-08 ENCOUNTER — Ambulatory Visit (INDEPENDENT_AMBULATORY_CARE_PROVIDER_SITE_OTHER): Payer: 59 | Admitting: Internal Medicine

## 2017-06-08 VITALS — BP 130/84 | HR 65 | Temp 98.4°F | Resp 16 | Ht 63.0 in | Wt 138.0 lb

## 2017-06-08 DIAGNOSIS — G43109 Migraine with aura, not intractable, without status migrainosus: Secondary | ICD-10-CM | POA: Diagnosis not present

## 2017-06-08 DIAGNOSIS — R739 Hyperglycemia, unspecified: Secondary | ICD-10-CM

## 2017-06-08 DIAGNOSIS — I10 Essential (primary) hypertension: Secondary | ICD-10-CM

## 2017-06-08 DIAGNOSIS — E559 Vitamin D deficiency, unspecified: Secondary | ICD-10-CM | POA: Diagnosis not present

## 2017-06-08 DIAGNOSIS — E785 Hyperlipidemia, unspecified: Secondary | ICD-10-CM | POA: Diagnosis not present

## 2017-06-08 DIAGNOSIS — Z91018 Allergy to other foods: Secondary | ICD-10-CM | POA: Diagnosis not present

## 2017-06-08 LAB — COMPREHENSIVE METABOLIC PANEL
ALT: 63 U/L — AB (ref 0–35)
AST: 43 U/L — ABNORMAL HIGH (ref 0–37)
Albumin: 4.5 g/dL (ref 3.5–5.2)
Alkaline Phosphatase: 171 U/L — ABNORMAL HIGH (ref 39–117)
BILIRUBIN TOTAL: 0.5 mg/dL (ref 0.2–1.2)
BUN: 10 mg/dL (ref 6–23)
CALCIUM: 9.7 mg/dL (ref 8.4–10.5)
CHLORIDE: 99 meq/L (ref 96–112)
CO2: 28 meq/L (ref 19–32)
Creatinine, Ser: 0.68 mg/dL (ref 0.40–1.20)
GFR: 94.3 mL/min (ref >60.00)
Glucose, Bld: 116 mg/dL — ABNORMAL HIGH (ref 70–99)
Potassium: 4.5 mEq/L (ref 3.5–5.1)
Sodium: 136 mEq/L (ref 135–145)
Total Protein: 7.5 g/dL (ref 6.0–8.3)

## 2017-06-08 LAB — CBC WITH DIFFERENTIAL/PLATELET
BASOS PCT: 1.1 % (ref 0.0–3.0)
Basophils Absolute: 0.1 10*3/uL (ref 0.0–0.1)
EOS PCT: 6.3 % — AB (ref 0.0–5.0)
Eosinophils Absolute: 0.4 10*3/uL (ref 0.0–0.7)
HCT: 41.4 % (ref 36.0–46.0)
HEMOGLOBIN: 14.3 g/dL (ref 12.0–15.0)
Lymphocytes Relative: 32.3 % (ref 12.0–46.0)
Lymphs Abs: 2.1 10*3/uL (ref 0.7–4.0)
MCHC: 34.6 g/dL (ref 30.0–36.0)
MCV: 88.3 fl (ref 78.0–100.0)
MONO ABS: 0.6 10*3/uL (ref 0.1–1.0)
MONOS PCT: 8.5 % (ref 3.0–12.0)
Neutro Abs: 3.4 10*3/uL (ref 1.4–7.7)
Neutrophils Relative %: 51.8 % (ref 43.0–77.0)
Platelets: 296 10*3/uL (ref 150.0–400.0)
RBC: 4.69 Mil/uL (ref 3.87–5.11)
RDW: 12.8 % (ref 11.5–15.5)
WBC: 6.6 10*3/uL (ref 4.0–10.5)

## 2017-06-08 LAB — LIPID PANEL
Cholesterol: 182 mg/dL (ref 0–200)
HDL: 47.2 mg/dL (ref >39.00)
LDL CALC: 106 mg/dL — AB (ref 0–99)
NonHDL: 134.83
TRIGLYCERIDES: 146 mg/dL (ref 0.0–149.0)
Total CHOL/HDL Ratio: 4
VLDL: 29.2 mg/dL (ref 0.0–40.0)

## 2017-06-08 LAB — TSH: TSH: 1.25 u[IU]/mL (ref 0.35–4.50)

## 2017-06-08 LAB — HEMOGLOBIN A1C: HEMOGLOBIN A1C: 6.1 % (ref 4.6–6.5)

## 2017-06-08 LAB — VITAMIN D 25 HYDROXY (VIT D DEFICIENCY, FRACTURES): VITD: 21.18 ng/mL — AB (ref 30.00–100.00)

## 2017-06-08 NOTE — Assessment & Plan Note (Signed)
Check level - it was high last year and she stopped her vitamin d

## 2017-06-08 NOTE — Assessment & Plan Note (Signed)
BP well controlled Current regimen effective and well tolerated Continue current medications at current doses Cmp, tsh 

## 2017-06-08 NOTE — Assessment & Plan Note (Signed)
Check lipid panel  Continue daily statin Regular exercise and healthy diet encouraged  

## 2017-06-08 NOTE — Assessment & Plan Note (Signed)
Takes excedrin as needed, maxalt rarely Infrequent migraines

## 2017-06-08 NOTE — Assessment & Plan Note (Signed)
Mild, intermittent Not taking any medication as needed Does not want to see an allergist at this time - will let me know if she changes her mind

## 2017-06-08 NOTE — Assessment & Plan Note (Signed)
Check a1c Low sugar / carb diet Stressed regular exercise   

## 2017-06-09 ENCOUNTER — Encounter: Payer: Self-pay | Admitting: Internal Medicine

## 2017-06-09 DIAGNOSIS — R7303 Prediabetes: Secondary | ICD-10-CM | POA: Insufficient documentation

## 2017-06-13 ENCOUNTER — Encounter: Payer: Self-pay | Admitting: Internal Medicine

## 2017-06-13 DIAGNOSIS — R945 Abnormal results of liver function studies: Principal | ICD-10-CM

## 2017-06-13 DIAGNOSIS — R7989 Other specified abnormal findings of blood chemistry: Secondary | ICD-10-CM

## 2017-06-28 DIAGNOSIS — H524 Presbyopia: Secondary | ICD-10-CM | POA: Diagnosis not present

## 2017-07-01 ENCOUNTER — Encounter: Payer: Self-pay | Admitting: Internal Medicine

## 2017-07-01 DIAGNOSIS — R220 Localized swelling, mass and lump, head: Secondary | ICD-10-CM

## 2017-07-11 ENCOUNTER — Ambulatory Visit (INDEPENDENT_AMBULATORY_CARE_PROVIDER_SITE_OTHER): Payer: 59 | Admitting: Allergy & Immunology

## 2017-07-11 ENCOUNTER — Encounter: Payer: Self-pay | Admitting: Allergy & Immunology

## 2017-07-11 VITALS — BP 112/60 | HR 68 | Temp 98.0°F | Resp 16 | Ht 64.0 in | Wt 140.0 lb

## 2017-07-11 DIAGNOSIS — R220 Localized swelling, mass and lump, head: Secondary | ICD-10-CM | POA: Diagnosis not present

## 2017-07-11 DIAGNOSIS — J3089 Other allergic rhinitis: Secondary | ICD-10-CM | POA: Diagnosis not present

## 2017-07-11 DIAGNOSIS — J302 Other seasonal allergic rhinitis: Secondary | ICD-10-CM

## 2017-07-11 MED ORDER — CRISABOROLE 2 % EX OINT: 1.0000 "application " | TOPICAL_OINTMENT | Freq: Two times a day (BID) | CUTANEOUS | 5 refills | Status: DC

## 2017-07-11 NOTE — Progress Notes (Signed)
NEW PATIENT  Date of Service/Encounter:  07/11/17  Referring provider: Pincus Sanes, MD   Assessment:   Lip swelling  Seasonal and perennial allergic rhinitis (weeds, trees, molds, dust mites)  Plan/Recommendations:   1. Lip swelling/irritation - It is unclear why you are experiencing the lip swelling with peeling. - Testing to all of the foods on our panel was negative. - There is a the low positive predictive value of food allergy testing and hence the high possibility of false positives. - In contrast, food allergy testing has a high negative predictive value, therefore if testing is negative we can be relatively assured that they are indeed negative. - Try using cetirizine  twice daily during the episodes to see if this helps.     - Start Eucrisa one application twice daily to help with the rash.  - Try to use a lip balm with the least amount of additives to see if this helps.   2. Seasonal allergic rhinitis - Testing today showed: one weed, one tree, one mold, and dust mites - We deferred performing intradermal testing since her allergy symptoms were not very severe.  - Avoidance measures provided. - Start taking: Zyrtec (cetirizine)  tablet once daily  - You can use an extra dose of the antihistamine, if needed, for breakthrough symptoms.  - Consider nasal saline rinses 1-2 times daily to remove allergens from the nasal cavities as well as help with mucous clearance (this is especially helpful to do before the nasal sprays are given) - Consider allergy shots as a means of long-term control. - Allergy shots "re-train" and "reset" the immune system to ignore environmental allergens and decrease the resulting immune response to those allergens (sneezing, itchy watery eyes, runny nose, nasal congestion, etc).    - Allergy shots improve symptoms in 75-85% of patients.  - We can discuss more at the next appointment if the medications are not working for you.  3.  Return in about 6 months (around 01/09/2018).  Subjective:   [name] is a 56 y.o. female presenting today for evaluation of  Chief Complaint  Patient presents with  . Oral Swelling    lips are swollen, itching, peeling. started last year with one episode. now she has had 2 episodes this year. wonders if it is tomatoes, stopped tomatoes and symptoms are slightly better.     [name] has a history of the following: Patient Active Problem List   Diagnosis Date Noted  . Prediabetes 06/09/2017  . Vitamin D deficiency 06/08/2017  . Food allergy 06/08/2017  . Osteoporosis 03/09/2016  . Abnormal LFTs (liver function tests) 06/13/2015  . Snoring 08/09/2012  . Dyslipidemia 05/07/2011  . Migraine headache 01/10/2009  . Essential hypertension 01/10/2009  . SCIATICA, LEFT 01/10/2009  . DIVERTICULITIS, HX OF 01/10/2009    History obtained from: chart review and patient.  [name] was referred by Pincus Sanes, MD.     [name] is a 56 y.o. female presenting for an evaluation of lip irritation. She developed redness and tingling on her lips last year in the fall. There was peeling of her lips but no overt swelling. She tried to figure it out but it resolved on its own for 3-4 weeks. Then in the spring it occurred again and it occurred again right now. It is a similar course at each of these episodes. She does use chap stick throughout the year, but the rash only occurs during certain times of the  year. This regular lip balm, various brands. She does report that her lips are very parched. She has not tried any antihistamines. This rash does not occur anywhere else. She did try some hydrocortisone around the edge of her mouth, but it did not do anything at all.   The tingling occurred after eating tomato sauce, but she has been off of this for few weeks without improvement. There have been no new medications whatsoever. She is on a migraine medication as well as a couple of blood  pressure medications. She has not changed toothpastes, mouthwashes, etc. She does not wear nail polish ever.   She does have some sneezing when she mows the yard. She is asymptomatic during the winter. She does have a dog for 13 years. She lives with a house mate, but this has been the same for a while. She denies traveling. She has had no fevers whatsoever.    Otherwise, there is no history of other atopic diseases, including asthma, drug allergies, stinging insect allergies, or urticaria. There is no significant infectious history. Vaccinations are up to date.    Past Medical History: Patient Active Problem List   Diagnosis Date Noted  . Prediabetes 06/09/2017  . Vitamin D deficiency 06/08/2017  . Food allergy 06/08/2017  . Osteoporosis 03/09/2016  . Abnormal LFTs (liver function tests) 06/13/2015  . Snoring 08/09/2012  . Dyslipidemia 05/07/2011  . Migraine headache 01/10/2009  . Essential hypertension 01/10/2009  . SCIATICA, LEFT 01/10/2009  . DIVERTICULITIS, HX OF 01/10/2009    Medication List:  Allergies as of 07/11/2017   No Known Allergies     Medication List       Accurate as of 07/11/17  4:33 PM. Always use your most recent med list.          EXCEDRIN MIGRAINE 250-250-65 MG tablet Generic drug:  aspirin-acetaminophen-caffeine Take 2 tablets by mouth every 6 (six) hours as needed.   hydrochlorothiazide 12.5 MG capsule Commonly known as:  MICROZIDE Take 1 capsule (12.5 mg total) by mouth daily. --- Office visit needed for further refills   metoprolol succinate 25 MG 24 hr tablet Commonly known as:  TOPROL-XL Take 1 tablet (25 mg total) by mouth daily. -- Office visit needed for further refills   rizatriptan 10 MG tablet Commonly known as:  MAXALT Take 10 mg by mouth as needed for migraine. May repeat in 2 hours if needed   simvastatin 20 MG tablet Commonly known as:  ZOCOR TAKE 1 TABLET BY MOUTH EVERY EVENING.       Birth History: non-contributory.    Developmental History: non-contributory.   Past Surgical History: Past Surgical History:  Procedure Laterality Date  . ABDOMINAL HYSTERECTOMY  2006   [facility]  . APPENDECTOMY  2008  . bladder Tact  2006  . LUMBAR FUSION  09/2010  . Right L5-S1 laminectomy  02/2009     Family History: Family History  Problem Relation Age of Onset  . Hypertension Mother   . Hypothyroidism Mother   . Coronary artery disease Father   . Hypertension Father   . Parkinson's disease Father   . Hypothyroidism Maternal Grandmother   . Coronary artery disease Maternal Grandmother   . Alzheimer's disease Maternal Grandmother 90  . Arthritis Paternal Grandmother   . Diabetes Paternal Grandmother   . Coronary artery disease Paternal Grandmother   . Kidney cancer Brother        RCC s/p nephrectomy  . Heart block Brother  PPM in 20's     Social History: Lequisha lives at home with her house mate. They live in a 58yo home. There is laminate in the main living areas and carpeting in the bedrooms. They have gas heating and central cooling. There is a dog in the home who is 67 years old. He does have dust mite coverings on the bed, but not the pillows. There is no cigarette exposure. 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 She did smoke for one year only.     Review of Systems: a 14-point review of systems is pertinent for what is mentioned in HPI.  Otherwise, all other systems were negative. Constitutional: negative other than that listed in the HPI Eyes: negative other than that listed in the HPI Ears, nose, mouth, throat, and face: negative other than that listed in the HPI Respiratory: negative other than that listed in the HPI Cardiovascular: negative other than that listed in the HPI Gastrointestinal: negative other than that listed in the HPI Genitourinary: negative other than that listed in the HPI Integument: negative other than that listed in the HPI Hematologic: negative  other than that listed in the HPI Musculoskeletal: negative other than that listed in the HPI Neurological: negative other than that listed in the HPI Allergy/Immunologic: negative other than that listed in the HPI    Objective:   Blood pressure 112/60, pulse 68, temperature 98 F (36.7 C), temperature source Oral, resp. rate 16, height  (1.626 m), weight 140 lb (63.5 kg), SpO2 97 %. Body mass index is 24.03 kg/m.   Physical Exam:  General: Alert, interactive, in no acute distress. Small stature. Friendly Eyes: No conjunctival injection bilaterally, no discharge on the right, no discharge on the left and no Horner-Trantas dots present. PERRL bilaterally. EOMI without pain. No photophobia.  Ears: Right TM pearly gray with normal light reflex, Left TM pearly gray with normal light reflex, Right TM unable to be visualized due to cerumen impaction and Left TM unable to be visualized due to cerumen impaction.  Nose/Throat: External nose within normal limits and septum midline. Turbinates edematous and pale without discharge. Posterior oropharynx mildly erythematous without cobblestoning in the posterior oropharynx. Tonsils 2+ without exudates.  Tongue without thrush. There is some well-defined erythema  Neck: Supple without thyromegaly. Trachea midline. Adenopathy: shoddy bilateral anterior cervical lymphadenopathy Lungs: Clear to auscultation without wheezing, rhonchi or rales. No increased work of breathing. CV: Normal S1/S2. No murmurs. Capillary refill <2 seconds.  Abdomen: Nondistended, nontender. No guarding or rebound tenderness. Bowel sounds present in all fields and hypoactive  Skin: Warm and dry, without lesions or rashes aside from the perioral rash.  Extremities:  No clubbing, cyanosis or edema. Neuro:   Grossly intact. No focal deficits appreciated. Responsive to questions.  Diagnostic studies:    Allergy Studies:   Indoor/Outdoor Percutaneous Adult Environmental  Panel: positive to common mugwort, ash, Rhizopus and Df mite. Otherwise negative with adequate controls.  Full Food Panel: positive to Lobster (2 x 2) with adequate controls. Negative to Peanut, Soy, Wheat, Corn, Milk, Egg, Casein, Shellfish Mix, Fish Mix, Cashew, Tomato, Orange, Rice, [employer], [place], [place], [place], Chicken, White Potato, Banana, Brittany, Peach, Sweet Potato, Green Pea, Strawberry, Onion, Cabbage, Carrots, Celery, Karaya Gum, Acacia (Arabic Gum), Cottonseed, Flounder, Trout, Shrimp, [place], [place], [place], [place], [place], [place], [place], [place], Cucumber, [address], [place], [place], [place], [place], [place], [place] nut, [place], Cinnamon, Nutmeg, Ginger, Garlic, Black Pepper, Mustard, Barley, Oat, Rye, Sesame and Pineapple  =  Salvatore Marvel, MD [facility] [place]

## 2017-07-11 NOTE — Patient Instructions (Addendum)
1. Lip swelling/irritation - It is unclear why you are experiencing the lip swelling with peeling. - Testing to all of the foods on our panel was negative. - There is a the low positive predictive value of food allergy testing and hence the high possibility of false positives. - In contrast, food allergy testing has a high negative predictive value, therefore if testing is negative we can be relatively assured that they are indeed negative. - Try using cetirizine  twice daily during the episodes to see if this helps.     - Start Eucrisa one application twice daily to help with the rash.  - Try to use a lip balm with the least amount of additives to see if this helps.   2. Seasonal allergic rhinitis - Testing today showed: one weed, one tree, one mold, and dust mites - Avoidance measures provided. - Start taking: Zyrtec (cetirizine)  tablet once daily  - You can use an extra dose of the antihistamine, if needed, for breakthrough symptoms.  - Consider nasal saline rinses 1-2 times daily to remove allergens from the nasal cavities as well as help with mucous clearance (this is especially helpful to do before the nasal sprays are given) - Consider allergy shots as a means of long-term control. - Allergy shots "re-train" and "reset" the immune system to ignore environmental allergens and decrease the resulting immune response to those allergens (sneezing, itchy watery eyes, runny nose, nasal congestion, etc).    - Allergy shots improve symptoms in 75-85% of patients.  - We can discuss more at the next appointment if the medications are not working for you.  3. Return in about 6 months (around 01/09/2018).  Please inform us of any Emergency Department visits, hospitalizations, or changes in symptoms. Call us before going to the ED for breathing or allergy symptoms since we might be able to fit you in for a sick visit. Feel free to contact us anytime with any questions, problems, or  concerns.  It was a pleasure to meet you today! Enjoy the upcoming fall season!  Websites that have reliable patient information: 1. American Academy of Asthma, Allergy, and Immunology: www.aaaai.org 2. Food Allergy Research and Education (FARE): foodallergy.org 3. Mothers of Asthmatics: http://www.asthmacommunitynetwork.org 4. American College of Allergy, Asthma, and Immunology: www.acaai.org   Election Day is coming up on Tuesday, November 6th! Make your voice heard! Register to vote at [email or web address]!     The last day to register is October 12th!   You can check the status of your registration by looking it up at [email or web address]  [facility] of Elections: [email or web address]  [facility] of Elections:  [email or web address]   Reducing Pollen Exposure  The American Academy of Allergy, Asthma and Immunology suggests the following steps to reduce your exposure to pollen during allergy seasons.    1. Do not hang sheets or clothing out to dry; pollen may collect on these items. 2. Do not mow lawns or spend time around freshly cut grass; mowing stirs up pollen. 3. Keep windows closed at night.  Keep car windows closed while driving. 4. Minimize morning activities outdoors, a time when pollen counts are usually at their highest. 5. Stay indoors as much as possible when pollen counts or humidity is high and on windy days when pollen tends to remain in the air longer. 6. Use air conditioning when possible.  Many air conditioners have filters that trap the pollen spores. 7. Use a HEPA room air filter to remove pollen  form the indoor air you breathe.  Control of Mold Allergen   Mold and fungi can grow on a variety of surfaces provided certain temperature and moisture conditions exist.  Outdoor molds grow on plants, decaying vegetation and soil.  The major outdoor mold, Alternaria and  Cladosporium, are found in very high numbers during hot and dry conditions.  Generally, a late Summer - Fall peak is seen for common outdoor fungal spores.  Rain will temporarily lower outdoor mold spore count, but counts rise rapidly when the rainy period ends.  The most important indoor molds are Aspergillus and Penicillium.  Dark, humid and poorly ventilated basements are ideal sites for mold growth.  The next most common sites of mold growth are the bathroom and the kitchen.   Indoor (Perennial) Mold Control   Positive indoor molds via skin testing: Rhizopus  1. Maintain humidity below 50%. 2. Clean washable surfaces with 5% bleach solution. 3. Remove sources e.g. contaminated carpets.

## 2017-07-13 ENCOUNTER — Other Ambulatory Visit (INDEPENDENT_AMBULATORY_CARE_PROVIDER_SITE_OTHER): Payer: 59

## 2017-07-13 DIAGNOSIS — R7989 Other specified abnormal findings of blood chemistry: Secondary | ICD-10-CM

## 2017-07-13 DIAGNOSIS — R945 Abnormal results of liver function studies: Secondary | ICD-10-CM | POA: Diagnosis not present

## 2017-07-13 LAB — HEPATIC FUNCTION PANEL
ALBUMIN: 4.2 g/dL (ref 3.5–5.2)
ALT: 84 U/L — AB (ref 0–35)
AST: 52 U/L — ABNORMAL HIGH (ref 0–37)
Alkaline Phosphatase: 171 U/L — ABNORMAL HIGH (ref 39–117)
Bilirubin, Direct: 0.1 mg/dL (ref 0.0–0.3)
TOTAL PROTEIN: 7.4 g/dL (ref 6.0–8.3)
Total Bilirubin: 0.3 mg/dL (ref 0.2–1.2)

## 2017-07-14 ENCOUNTER — Encounter: Payer: Self-pay | Admitting: Internal Medicine

## 2017-07-14 DIAGNOSIS — R945 Abnormal results of liver function studies: Principal | ICD-10-CM

## 2017-07-14 DIAGNOSIS — R7989 Other specified abnormal findings of blood chemistry: Secondary | ICD-10-CM

## 2017-08-01 ENCOUNTER — Ambulatory Visit
Admission: RE | Admit: 2017-08-01 | Discharge: 2017-08-01 | Disposition: A | Discharge status: Home / Self Care | Payer: 59 | Source: Ambulatory Visit | Attending: Internal Medicine | Admitting: Internal Medicine

## 2017-08-01 DIAGNOSIS — R945 Abnormal results of liver function studies: Principal | ICD-10-CM

## 2017-08-01 DIAGNOSIS — R7989 Other specified abnormal findings of blood chemistry: Secondary | ICD-10-CM

## 2017-08-01 DIAGNOSIS — K8020 Calculus of gallbladder without cholecystitis without obstruction: Secondary | ICD-10-CM | POA: Diagnosis not present

## 2017-08-02 ENCOUNTER — Encounter: Payer: Self-pay | Admitting: Internal Medicine

## 2017-08-02 DIAGNOSIS — R945 Abnormal results of liver function studies: Secondary | ICD-10-CM

## 2017-08-02 DIAGNOSIS — R7989 Other specified abnormal findings of blood chemistry: Secondary | ICD-10-CM

## 2017-08-02 DIAGNOSIS — K8020 Calculus of gallbladder without cholecystitis without obstruction: Secondary | ICD-10-CM | POA: Insufficient documentation

## 2017-08-16 ENCOUNTER — Other Ambulatory Visit: Payer: Self-pay | Admitting: Internal Medicine

## 2017-08-16 MED FILL — HYDROCHLOROTHIAZIDE 12.5 MG: 12.5 | 90 days supply | Qty: 90 | Fill #0

## 2017-08-16 MED FILL — METOPROLOL SUCC ER 25 MG TA: 25 | 90 days supply | Qty: 90 | Fill #0

## 2017-11-21 MED FILL — HYDROCHLOROTHIAZIDE 12.5 MG: 12.5 | 90 days supply | Qty: 90 | Fill #1

## 2017-11-21 MED FILL — METOPROLOL SUCCINATE ER 25: 25 | 90 days supply | Qty: 90 | Fill #1

## 2017-12-14 ENCOUNTER — Encounter: Payer: Self-pay | Admitting: Internal Medicine

## 2018-01-09 ENCOUNTER — Ambulatory Visit: Payer: 59 | Admitting: Allergy & Immunology

## 2018-02-28 MED FILL — METOPROLOL SUCCINATE ER 25: 25 | 90 days supply | Qty: 90 | Fill #2

## 2018-03-05 NOTE — Patient Instructions (Addendum)
Test(s) ordered today. Your results will be released to [facility] (or called to you) after review, usually within 72hours after test completion. If any changes need to be made, you will be notified at that same time.  All other Health Maintenance issues reviewed.   All recommended immunizations and age-appropriate screenings are up-to-date or discussed.  No immunizations administered today.   Medications reviewed and updated.  No changes recommended at this time.   Please followup in 6 months   Health Maintenance, Female Adopting a healthy lifestyle and getting preventive care can go a long way to promote health and wellness. Talk with your health care provider about what schedule of regular examinations is right for you. This is a good chance for you to check in with your provider about disease prevention and staying healthy. In between checkups, there are plenty of things you can do on your own. Experts have done a lot of research about which lifestyle changes and preventive measures are most likely to keep you healthy. Ask your health care provider for more information. Weight and diet Eat a healthy diet  Be sure to include plenty of vegetables, fruits, low-fat dairy products, and lean protein.  Do not eat a lot of foods high in solid fats, added sugars, or salt.  Get regular exercise. This is one of the most important things you can do for your health. ? Most adults should exercise for at least 150 minutes each week. The exercise should increase your heart rate and make you sweat (moderate-intensity exercise). ? Most adults should also do strengthening exercises at least twice a week. This is in addition to the moderate-intensity exercise.  Maintain a healthy weight  Body mass index (BMI) is a measurement that can be used to identify possible weight problems. It estimates body fat based on height and weight. Your health care provider can help determine your BMI and help you achieve or  maintain a healthy weight.  For females 40 years of age and older: ? A BMI below 18.5 is considered underweight. ? A BMI of 18.5 to 24.9 is normal. ? A BMI of 25 to 29.9 is considered overweight. ? A BMI of 30 and above is considered obese.  Watch levels of cholesterol and blood lipids  You should start having your blood tested for lipids and cholesterol at 59 years of age, then have this test every 5 years.  You may need to have your cholesterol levels checked more often if: ? Your lipid or cholesterol levels are high. ? You are older than 59 years of age. ? You are at high risk for heart disease.  Cancer screening Lung Cancer  Lung cancer screening is recommended for adults 59-64 years old who are at high risk for lung cancer because of a history of smoking.  A yearly low-dose CT scan of the lungs is recommended for people who: ? Currently smoke. ? Have quit within the past 15 years. ? Have at least a 30-pack-year history of smoking. A pack year is smoking an average of one pack of cigarettes a day for 1 year.  Yearly screening should continue until it has been 15 years since you quit.  Yearly screening should stop if you develop a health problem that would prevent you from having lung cancer treatment.  Breast Cancer  Practice breast self-awareness. This means understanding how your breasts normally appear and feel.  It also means doing regular breast self-exams. Let your health care provider know about any changes,  no matter how small.  If you are in your 20s or 30s, you should have a clinical breast exam (CBE) by a health care provider every 1-3 years as part of a regular health exam.  If you are 40 or older, have a CBE every year. Also consider having a breast X-ray (mammogram) every year.  If you have a family history of breast cancer, talk to your health care provider about genetic screening.  If you are at high risk for breast cancer, talk to your health care  provider about having an MRI and a mammogram every year.  Breast cancer gene (BRCA) assessment is recommended for women who have family members with BRCA-related cancers. BRCA-related cancers include: ? Breast. ? Ovarian. ? Tubal. ? Peritoneal cancers.  Results of the assessment will determine the need for genetic counseling and BRCA1 and BRCA2 testing.  Cervical Cancer Your health care provider may recommend that you be screened regularly for cancer of the pelvic organs (ovaries, uterus, and vagina). This screening involves a pelvic examination, including checking for microscopic changes to the surface of your cervix (Pap test). You may be encouraged to have this screening done every 3 years, beginning at age 21.  For women ages 30-65, health care providers may recommend pelvic exams and Pap testing every 3 years, or they may recommend the Pap and pelvic exam, combined with testing for human papilloma virus (HPV), every 5 years. Some types of HPV increase your risk of cervical cancer. Testing for HPV may also be done on women of any age with unclear Pap test results.  Other health care providers may not recommend any screening for nonpregnant women who are considered low risk for pelvic cancer and who do not have symptoms. Ask your health care provider if a screening pelvic exam is right for you.  If you have had past treatment for cervical cancer or a condition that could lead to cancer, you need Pap tests and screening for cancer for at least 20 years after your treatment. If Pap tests have been discontinued, your risk factors (such as having a new sexual partner) need to be reassessed to determine if screening should resume. Some women have medical problems that increase the chance of getting cervical cancer. In these cases, your health care provider may recommend more frequent screening and Pap tests.  Colorectal Cancer  This type of cancer can be detected and often prevented.  Routine  colorectal cancer screening usually begins at 59 years of age and continues through 59 years of age.  Your health care provider may recommend screening at an earlier age if you have risk factors for colon cancer.  Your health care provider may also recommend using home test kits to check for hidden blood in the stool.  A small camera at the end of a tube can be used to examine your colon directly (sigmoidoscopy or colonoscopy). This is done to check for the earliest forms of colorectal cancer.  Routine screening usually begins at age 50.  Direct examination of the colon should be repeated every 5-10 years through 59 years of age. However, you may need to be screened more often if early forms of precancerous polyps or small growths are found.  Skin Cancer  Check your skin from head to toe regularly.  Tell your health care provider about any new moles or changes in moles, especially if there is a change in a mole's shape or color.  Also tell your health care provider if you   have a mole that is larger than the size of a pencil eraser.  Always use sunscreen. Apply sunscreen liberally and repeatedly throughout the day.  Protect yourself by wearing long sleeves, pants, a wide-brimmed hat, and sunglasses whenever you are outside.  Heart disease, diabetes, and high blood pressure  High blood pressure causes heart disease and increases the risk of stroke. High blood pressure is more likely to develop in: ? People who have blood pressure in the high end of the normal range (130-139/85-89 mm Hg). ? People who are overweight or obese. ? People who are African American.  If you are 24-25 years of age, have your blood pressure checked every 3-5 years. If you are 2 years of age or older, have your blood pressure checked every year. You should have your blood pressure measured twice-once when you are at a hospital or clinic, and once when you are not at a hospital or clinic. Record the average of the  two measurements. To check your blood pressure when you are not at a hospital or clinic, you can use: ? An automated blood pressure machine at a pharmacy. ? A home blood pressure monitor.  If you are between 42 years and 59 years old, ask your health care provider if you should take aspirin to prevent strokes.  Have regular diabetes screenings. This involves taking a blood sample to check your fasting blood sugar level. ? If you are at a normal weight and have a low risk for diabetes, have this test once every three years after 59 years of age. ? If you are overweight and have a high risk for diabetes, consider being tested at a younger age or more often. Preventing infection Hepatitis B  If you have a higher risk for hepatitis B, you should be screened for this virus. You are considered at high risk for hepatitis B if: ? You were born in a country where hepatitis B is common. Ask your health care provider which countries are considered high risk. ? Your parents were born in a high-risk country, and you have not been immunized against hepatitis B (hepatitis B vaccine). ? You have HIV or AIDS. ? You use needles to inject street drugs. ? You live with someone who has hepatitis B. ? You have had sex with someone who has hepatitis B. ? You get hemodialysis treatment. ? You take certain medicines for conditions, including cancer, organ transplantation, and autoimmune conditions.  Hepatitis C  Blood testing is recommended for: ? Everyone born from [birth year] through 1967. ? Anyone with known risk factors for hepatitis C.  Sexually transmitted infections (STIs)  You should be screened for sexually transmitted infections (STIs) including gonorrhea and chlamydia if: ? You are sexually active and are younger than 59 years of age. ? You are older than 59 years of age and your health care provider tells you that you are at risk for this type of infection. ? Your sexual activity has changed since you  were last screened and you are at an increased risk for chlamydia or gonorrhea. Ask your health care provider if you are at risk.  If you do not have HIV, but are at risk, it may be recommended that you take a prescription medicine daily to prevent HIV infection. This is called pre-exposure prophylaxis (PrEP). You are considered at risk if: ? You are sexually active and do not regularly use condoms or know the HIV status of your partner(s). ? You take drugs by injection. ?  You are sexually active with a partner who has HIV.  Talk with your health care provider about whether you are at high risk of being infected with HIV. If you choose to begin PrEP, you should first be tested for HIV. You should then be tested every 3 months for as long as you are taking PrEP. Pregnancy  If you are premenopausal and you may become pregnant, ask your health care provider about preconception counseling.  If you may become pregnant, take 400 to 800 micrograms (mcg) of folic acid every day.  If you want to prevent pregnancy, talk to your health care provider about birth control (contraception). Osteoporosis and menopause  Osteoporosis is a disease in which the bones lose minerals and strength with aging. This can result in serious bone fractures. Your risk for osteoporosis can be identified using a bone density scan.  If you are 65 years of age or older, or if you are at risk for osteoporosis and fractures, ask your health care provider if you should be screened.  Ask your health care provider whether you should take a calcium or vitamin D supplement to lower your risk for osteoporosis.  Menopause may have certain physical symptoms and risks.  Hormone replacement therapy may reduce some of these symptoms and risks. Talk to your health care provider about whether hormone replacement therapy is right for you. Follow these instructions at home:  Schedule regular health, dental, and eye exams.  Stay current  with your immunizations.  Do not use any tobacco products including cigarettes, chewing tobacco, or electronic cigarettes.  If you are pregnant, do not drink alcohol.  If you are breastfeeding, limit how much and how often you drink alcohol.  Limit alcohol intake to no more than 1 drink per day for nonpregnant women. One drink equals 12 ounces of beer, 5 ounces of wine, or 1 ounces of hard liquor.  Do not use street drugs.  Do not share needles.  Ask your health care provider for help if you need support or information about quitting drugs.  Tell your health care provider if you often feel depressed.  Tell your health care provider if you have ever been abused or do not feel safe at home. This information is not intended to replace advice given to you by your health care provider. Make sure you discuss any questions you have with your health care provider. Document Released: 04/05/2011 Document Revised: 02/26/2016 Document Reviewed: 06/24/2015 Elsevier Interactive Patient Education  2018 Elsevier Inc.  

## 2018-03-05 NOTE — Progress Notes (Signed)
Subjective:    Patient ID: [name], female    DOB: 1961-04-07, 57 y.o.   MRN: [id number]  HPI She is here for a physical exam.   We held the simvastatin to see if that would improve her lft's - she has not been taking it.    H/o pvcs - put on metoprolol for palpitations.  15 sec different than pvcs - intense - it did scare her.  No obvious cause -- one time at work, one time at home.    No migraine in 6-8 mo.    Medications and allergies reviewed with patient and updated if appropriate.  Patient Active Problem List   Diagnosis Date Noted  . Cholelithiasis 08/02/2017  . Prediabetes 06/09/2017  . Vitamin D deficiency 06/08/2017  . Food allergy 06/08/2017  . Osteoporosis 03/09/2016  . Abnormal LFTs (liver function tests) 06/13/2015  . Snoring 08/09/2012  . Dyslipidemia 05/07/2011  . Migraine headache 01/10/2009  . Essential hypertension 01/10/2009  . SCIATICA, LEFT 01/10/2009  . DIVERTICULITIS, HX OF 01/10/2009    Current Outpatient Medications on File Prior to Visit  Medication Sig Dispense Refill  . aspirin-acetaminophen-caffeine (EXCEDRIN MIGRAINE) 250-250-65 MG per tablet Take 2 tablets by mouth every 6 (six) hours as needed.     Lennox Solders. Crisaborole (EUCRISA) 2 % OINT Apply 1 application topically 2 (two) times daily. 60 g 5  . hydrochlorothiazide (MICROZIDE) 12.5 MG capsule Take 1 capsule (12.5 mg total) daily by mouth. 90 capsule 3  . metoprolol succinate (TOPROL-XL) 25 MG 24 hr tablet TAKE 1 TABLET BY MOUTH DAILY 90 tablet 3  . rizatriptan (MAXALT) 10 MG tablet Take 10 mg by mouth as needed for migraine. May repeat in 2 hours if needed    . simvastatin (ZOCOR) 20 MG tablet TAKE 1 TABLET BY MOUTH EVERY EVENING. (Patient not taking: Reported on 03/06/2018) 90 tablet 1   No current facility-administered medications on file prior to visit.     Past Medical History:  Diagnosis Date  . ANEMIA-NOS   . Chronic migraine    improved w/ "LBS" herbal  . DIVERTICULITIS, HX  OF   . HYPERTENSION   . MENOPAUSAL SYNDROME   . OSTEOPENIA   . SCIATICA, LEFT    resolved s/p l5-s1 fusion 09/2010  . Shingles outbreak 1997   R thoracic outbreak    Past Surgical History:  Procedure Laterality Date  . ABDOMINAL HYSTERECTOMY  2006   [facility]  . APPENDECTOMY  2008  . bladder Tact  2006  . LUMBAR FUSION  09/2010  . Right L5-S1 laminectomy  02/2009    Social History   Socioeconomic History  . Marital status: Single    Spouse name: Not on file  . Number of children: Not on file  . Years of education: Not on file  . Highest education level: Not on file  Occupational History  . Not on file  Social Needs  . Financial resource strain: Not on file  . Food insecurity:    Worry: Not on file    Inability: Not on file  . Transportation needs:    Medical: Not on file    Non-medical: Not on file  Tobacco Use  . Smoking status: Former Smoker    Packs/day: 1.00    Years: 15.00    Pack years: 15.00    Types: Cigarettes    Last attempt to quit: 10/04/1992    Years since quitting: 25.4  . Smokeless tobacco: Never Used  Substance and  Sexual Activity  . Alcohol use: Yes    Comment: glass wine per month  . Drug use: No  . Sexual activity: Not on file  Lifestyle  . Physical activity:    Days per week: Not on file    Minutes per session: Not on file  . Stress: Not on file  Relationships  . Social connections:    Talks on phone: Not on file    Gets together: Not on file    Attends religious service: Not on file    Active member of club or organization: Not on file    Attends meetings of clubs or organizations: Not on file    Relationship status: Not on file  Other Topics Concern  . 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 Hypertension Mother   . Hypothyroidism Mother   . Coronary artery disease Father   . Hypertension  Father   . Parkinson's disease Father   . Hypothyroidism Maternal Grandmother   . Coronary artery disease Maternal Grandmother   . Alzheimer's disease Maternal Grandmother 51  . Arthritis Paternal Grandmother   . Diabetes Paternal Grandmother   . Coronary artery disease Paternal Grandmother   . Kidney cancer Brother        RCC s/p nephrectomy  . Heart block Brother        PPM in 20's    Review of Systems  Constitutional: Negative for chills and fever.  Eyes: Negative for visual disturbance.  Respiratory: Negative for cough, shortness of breath and wheezing.   Cardiovascular: Positive for palpitations (two episodes - different than PVCs). Negative for chest pain and leg swelling.  Gastrointestinal: Negative for abdominal pain, blood in stool, constipation, diarrhea and nausea.  Genitourinary: Negative for dysuria and hematuria.  Musculoskeletal: Negative for arthralgias and back pain.  Skin: Negative for color change and rash.  Neurological: Negative for light-headedness and headaches.  Psychiatric/Behavioral: Negative for dysphoric mood. The patient is not nervous/anxious.        Objective:   Vitals:   03/06/18 1529  BP: 112/80  Pulse: 81  Resp: 16  Temp: 97.9 F (36.6 C)  SpO2: 98%   Filed Weights   03/06/18 1529  Weight: 140 lb (63.5 kg)   Body mass index is 24.03 kg/m.  Wt Readings from Last 3 Encounters:  03/06/18 140 lb (63.5 kg)  07/11/17 140 lb (63.5 kg)  06/08/17 138 lb (62.6 kg)     Physical Exam Constitutional: She appears well-developed and well-nourished. No distress.  HENT:  Head: Normocephalic and atraumatic.  Right Ear: External ear normal. Normal ear canal and TM Left Ear: External ear normal.  Normal ear canal and TM Mouth/Throat: Oropharynx is clear and moist.  Eyes: Conjunctivae and EOM are normal.  Neck: Neck supple. No tracheal deviation present. No thyromegaly present.  No carotid bruit  Cardiovascular: Normal rate, regular rhythm  and normal heart sounds.   No murmur heard.  No edema. Pulmonary/Chest: Effort normal and breath sounds normal. No respiratory distress. She has no wheezes. She has no rales.  Breast: deferred to Gyn Abdominal: Soft. She exhibits no distension. There is no tenderness.  Lymphadenopathy: She has no cervical adenopathy.  Skin: Skin is warm and dry. She is not diaphoretic.  Psychiatric: She has a normal mood and affect. Her behavior is normal.  Assessment & Plan:   Physical exam: Screening blood work  ordered Immunizations   Discussed shingrix, others up to date Colonoscopy   Up to date  Mammogram  - due - [facility] - will schedule Gyn  - no longer goes Dexa - declined Eye exams  Up to date  EKG   Done 2015 Exercise  Active, no regular exercise-stressed the importance of regular exercise Weight   normal BMI Skin   no concerns Substance abuse   none  See Problem List for Assessment and Plan of chronic medical problems.    Follow-up in 6 months

## 2018-03-06 ENCOUNTER — Ambulatory Visit (INDEPENDENT_AMBULATORY_CARE_PROVIDER_SITE_OTHER): Payer: 59 | Admitting: Internal Medicine

## 2018-03-06 ENCOUNTER — Other Ambulatory Visit (INDEPENDENT_AMBULATORY_CARE_PROVIDER_SITE_OTHER): Payer: 59

## 2018-03-06 ENCOUNTER — Encounter: Payer: Self-pay | Admitting: Internal Medicine

## 2018-03-06 VITALS — BP 112/80 | HR 81 | Temp 97.9°F | Resp 16 | Ht 64.0 in | Wt 140.0 lb

## 2018-03-06 DIAGNOSIS — R002 Palpitations: Secondary | ICD-10-CM

## 2018-03-06 DIAGNOSIS — R7303 Prediabetes: Secondary | ICD-10-CM | POA: Diagnosis not present

## 2018-03-06 DIAGNOSIS — Z0000 Encounter for general adult medical examination without abnormal findings: Secondary | ICD-10-CM | POA: Diagnosis not present

## 2018-03-06 DIAGNOSIS — G43109 Migraine with aura, not intractable, without status migrainosus: Secondary | ICD-10-CM

## 2018-03-06 DIAGNOSIS — Z0001 Encounter for general adult medical examination with abnormal findings: Secondary | ICD-10-CM | POA: Diagnosis not present

## 2018-03-06 DIAGNOSIS — E785 Hyperlipidemia, unspecified: Secondary | ICD-10-CM

## 2018-03-06 DIAGNOSIS — R7989 Other specified abnormal findings of blood chemistry: Secondary | ICD-10-CM

## 2018-03-06 DIAGNOSIS — E559 Vitamin D deficiency, unspecified: Secondary | ICD-10-CM

## 2018-03-06 DIAGNOSIS — M810 Age-related osteoporosis without current pathological fracture: Secondary | ICD-10-CM

## 2018-03-06 DIAGNOSIS — I10 Essential (primary) hypertension: Secondary | ICD-10-CM

## 2018-03-06 DIAGNOSIS — R945 Abnormal results of liver function studies: Secondary | ICD-10-CM

## 2018-03-06 LAB — LIPID PANEL
CHOL/HDL RATIO: 5
CHOLESTEROL: 272 mg/dL — AB (ref 0–200)
HDL: 50 mg/dL (ref >39.00)
LDL Cholesterol: 193 mg/dL — ABNORMAL HIGH (ref 0–99)
NonHDL: 221.85
Triglycerides: 143 mg/dL (ref 0.0–149.0)
VLDL: 28.6 mg/dL (ref 0.0–40.0)

## 2018-03-06 LAB — CBC WITH DIFFERENTIAL/PLATELET
BASOS ABS: 0.1 10*3/uL (ref 0.0–0.1)
BASOS PCT: 1 % (ref 0.0–3.0)
EOS ABS: 0.2 10*3/uL (ref 0.0–0.7)
Eosinophils Relative: 2.9 % (ref 0.0–5.0)
HCT: 41 % (ref 36.0–46.0)
Hemoglobin: 14.1 g/dL (ref 12.0–15.0)
LYMPHS ABS: 2.7 10*3/uL (ref 0.7–4.0)
Lymphocytes Relative: 38.2 % (ref 12.0–46.0)
MCHC: 34.4 g/dL (ref 30.0–36.0)
MCV: 88.8 fl (ref 78.0–100.0)
MONO ABS: 0.6 10*3/uL (ref 0.1–1.0)
Monocytes Relative: 8 % (ref 3.0–12.0)
NEUTROS ABS: 3.5 10*3/uL (ref 1.4–7.7)
NEUTROS PCT: 49.9 % (ref 43.0–77.0)
PLATELETS: 298 10*3/uL (ref 150.0–400.0)
RBC: 4.61 Mil/uL (ref 3.87–5.11)
RDW: 12.8 % (ref 11.5–15.5)
WBC: 7.1 10*3/uL (ref 4.0–10.5)

## 2018-03-06 LAB — TSH: TSH: 0.83 u[IU]/mL (ref 0.35–4.50)

## 2018-03-06 LAB — COMPREHENSIVE METABOLIC PANEL
ALT: 75 U/L — AB (ref 0–35)
AST: 40 U/L — AB (ref 0–37)
Albumin: 4.6 g/dL (ref 3.5–5.2)
Alkaline Phosphatase: 148 U/L — ABNORMAL HIGH (ref 39–117)
BILIRUBIN TOTAL: 0.6 mg/dL (ref 0.2–1.2)
BUN: 9 mg/dL (ref 6–23)
CALCIUM: 9.6 mg/dL (ref 8.4–10.5)
CO2: 29 meq/L (ref 19–32)
CREATININE: 0.61 mg/dL (ref 0.40–1.20)
Chloride: 101 mEq/L (ref 96–112)
GFR: 106.62 mL/min (ref >60.00)
Glucose, Bld: 94 mg/dL (ref 70–99)
Potassium: 4.4 mEq/L (ref 3.5–5.1)
SODIUM: 138 meq/L (ref 135–145)
Total Protein: 7.6 g/dL (ref 6.0–8.3)

## 2018-03-06 LAB — VITAMIN D 25 HYDROXY (VIT D DEFICIENCY, FRACTURES): VITD: 16.74 ng/mL — AB (ref 30.00–100.00)

## 2018-03-06 LAB — HEMOGLOBIN A1C: HEMOGLOBIN A1C: 6.1 % (ref 4.6–6.5)

## 2018-03-06 MED ORDER — ZOSTER VAC RECOMB ADJUVANTED 50 MCG/0.5ML IM SUSR: 0.5000 mL | Freq: Once | INTRAMUSCULAR | 1 refills | Status: AC

## 2018-03-06 MED FILL — HYDROCHLOROTHIAZIDE 12.5 MG: 12.5 | 90 days supply | Qty: 90 | Fill #2

## 2018-03-06 NOTE — Assessment & Plan Note (Signed)
Level was elevated when she was taking 2000 units daily We will check today-if low will start 1000 units daily

## 2018-03-06 NOTE — Assessment & Plan Note (Signed)
I believe there is no change last year after stopping the statin, but she did not restart it-we will recheck CMP, lipid panel Depending on results we will restart simvastatin

## 2018-03-06 NOTE — Assessment & Plan Note (Signed)
Check a1c Low sugar / carb diet Stressed regular exercise   

## 2018-03-06 NOTE — Assessment & Plan Note (Signed)
DEXA due-she deferred for a year.  She does not want to take the medication if needed Stressed the importance of regular exercise Will check vitamin D level-it was elevated in the past and I advised her to stop it, but then was recently low Most likely will need to start calcium and vitamin D 1000 units daily

## 2018-03-06 NOTE — Assessment & Plan Note (Signed)
Placed on metoprolol for palpitations - PVC's Has been fairly well controlled. Recently has experienced 2 episodes of intense palpitations that lasted approximately 15 seconds-they did not feel like PVCs One episode was when it was she was at home, one at work She has not had another episode since then No obvious chest pain, lightheadedness or shortness of breath associated, but it was relatively quick If recurs will refer to cardiology

## 2018-03-06 NOTE — Assessment & Plan Note (Signed)
Secondary to LFTs being elevated we discontinued simvastatin CMP, lipid panel today LFT elevation may be mild fatty liver Depending on results we will restart simvastatin

## 2018-03-06 NOTE — Assessment & Plan Note (Signed)
BP well controlled Current regimen effective and well tolerated Continue current medications at current doses Cmp, tsh 

## 2018-03-06 NOTE — Assessment & Plan Note (Signed)
Mostly hormonal She has not had a migraine 6-8 months Rarely takes Excedrin Migraine or Maxalt

## 2018-03-09 ENCOUNTER — Encounter: Payer: Self-pay | Admitting: Internal Medicine

## 2018-03-10 ENCOUNTER — Encounter: Payer: Self-pay | Admitting: Internal Medicine

## 2018-03-10 MED ORDER — ATORVASTATIN CALCIUM 10 MG PO TABS: 10.0000 mg | ORAL_TABLET | Freq: Every day | ORAL | 3 refills | Status: DC

## 2018-03-14 MED FILL — ATORVASTATIN 10 MG TABLET: 10 | 90 days supply | Qty: 90 | Fill #0

## 2018-04-25 IMAGING — US US ABDOMEN LIMITED
1 series · 14 of 25 positions shown · non-contrast
Comparison: None.

CLINICAL DATA: Elevated LFTs

EXAM:
ULTRASOUND ABDOMEN LIMITED RIGHT UPPER QUADRANT

[Series 1: us abdomen limited · 0.17mm/px · 14 of 52 slices shown]
[im 1/52]
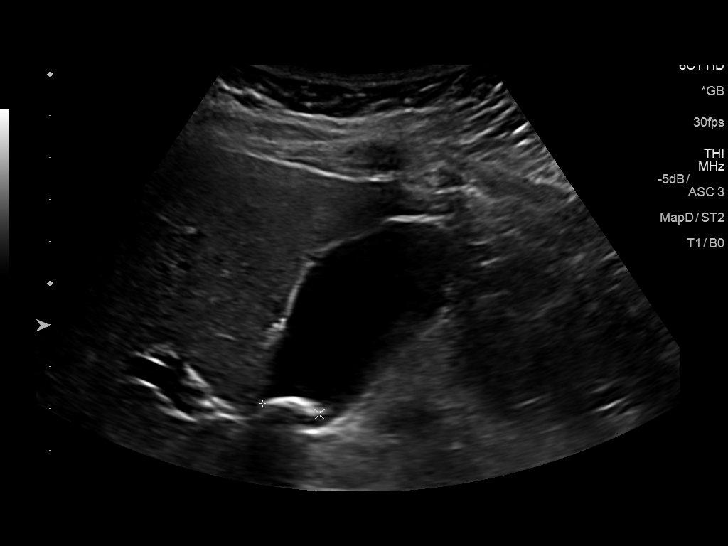
[im 5/52]
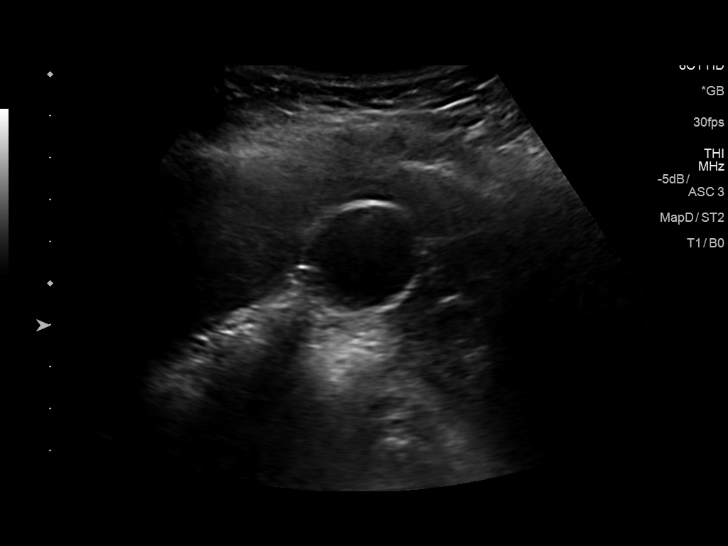
[im 9/52]
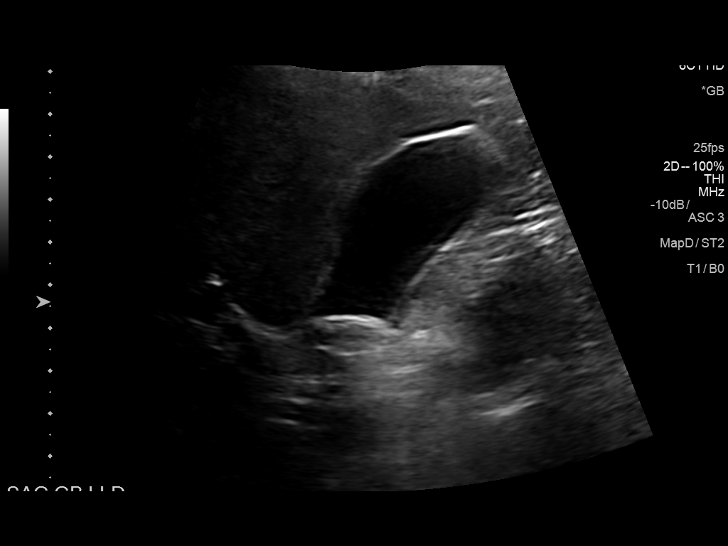
[im 13/52]
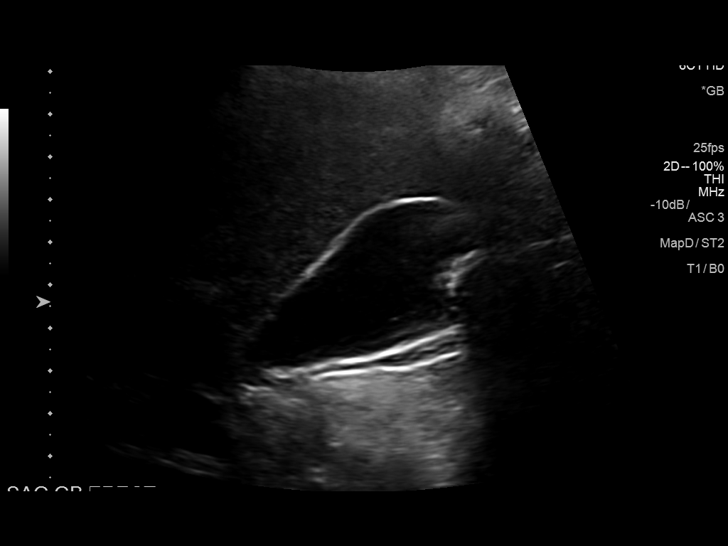
[im 18/52]
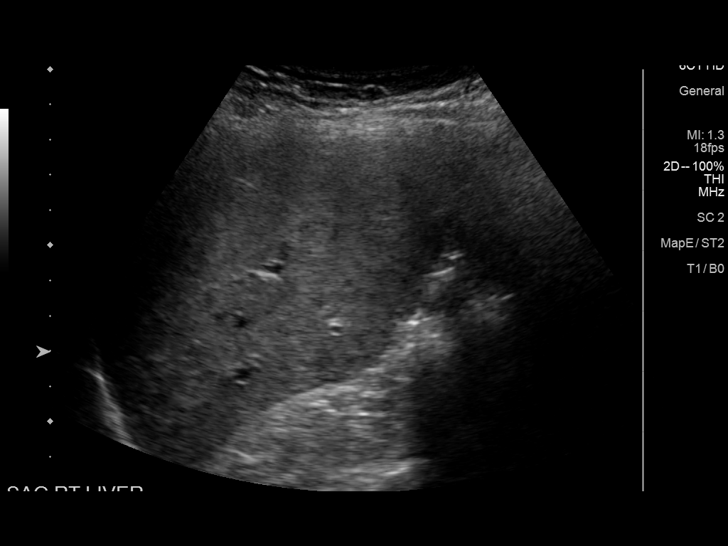
[im 20/52]
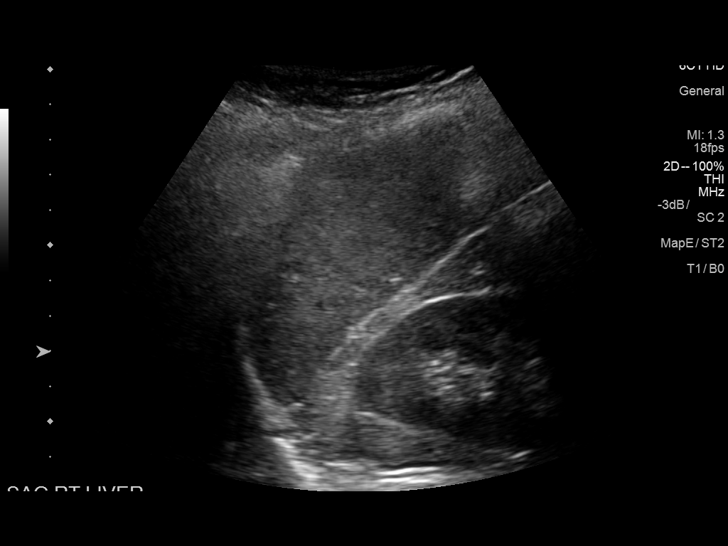
[im 24/52]
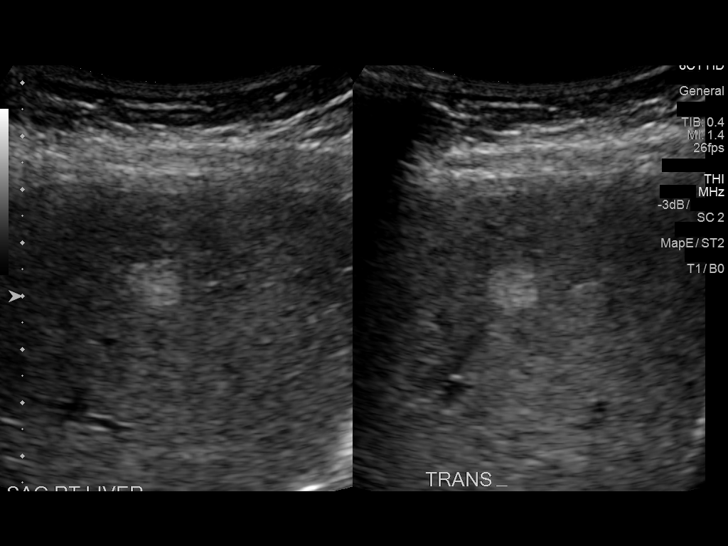
[im 28/52]
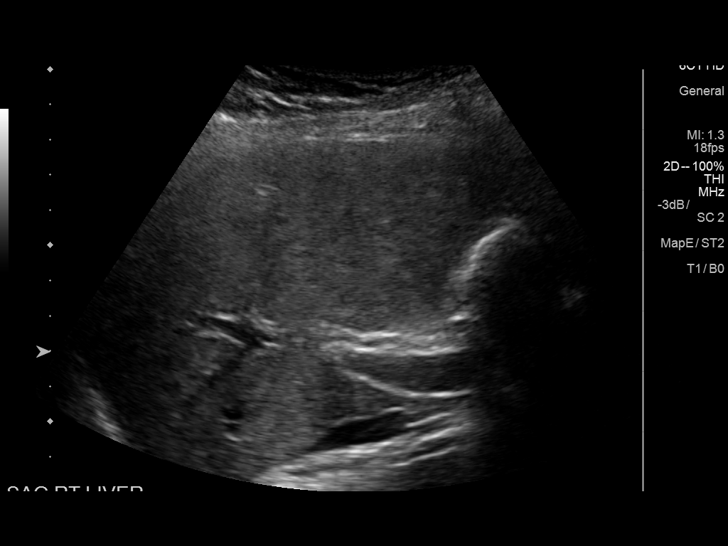
[im 32/52]
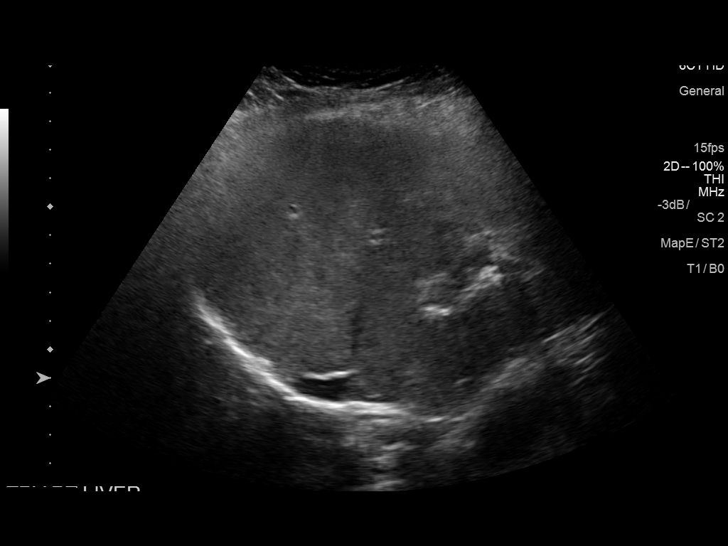
[im 35/52]
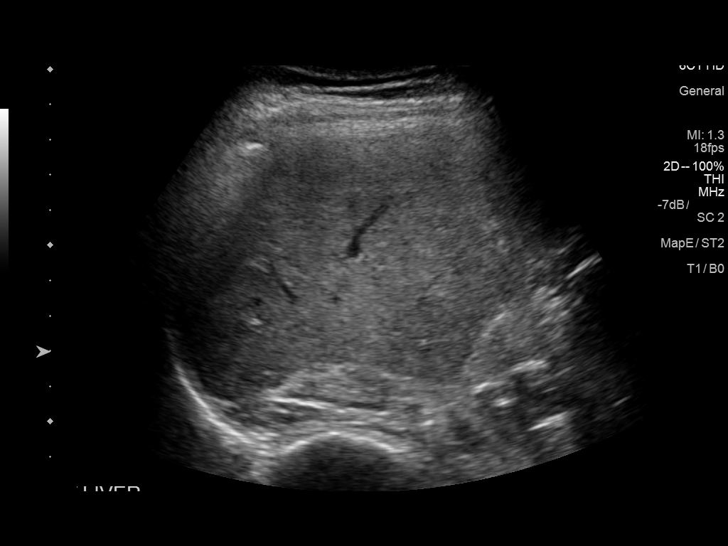
[im 39/52]
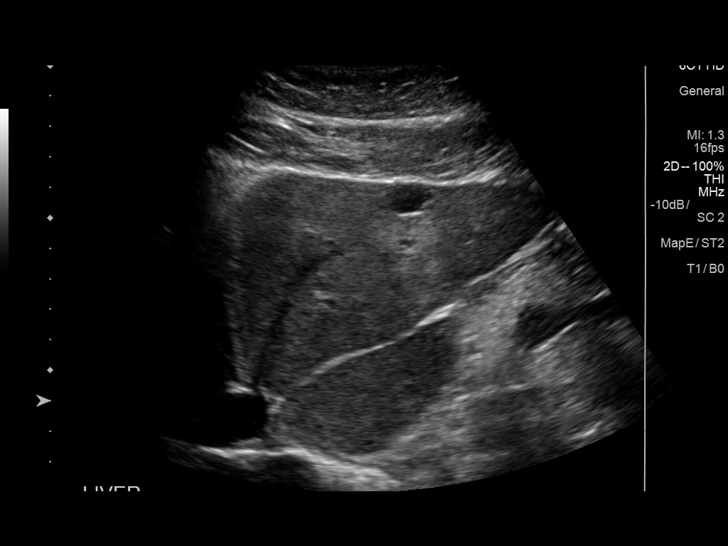
[im 43/52]
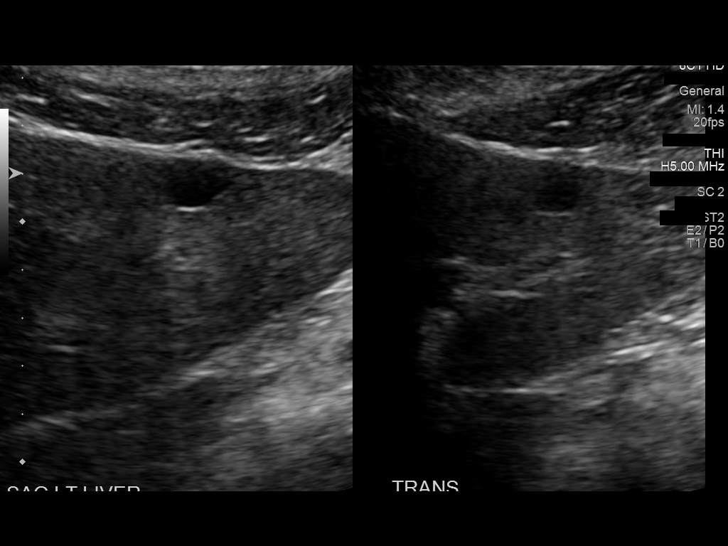
[im 47/52]
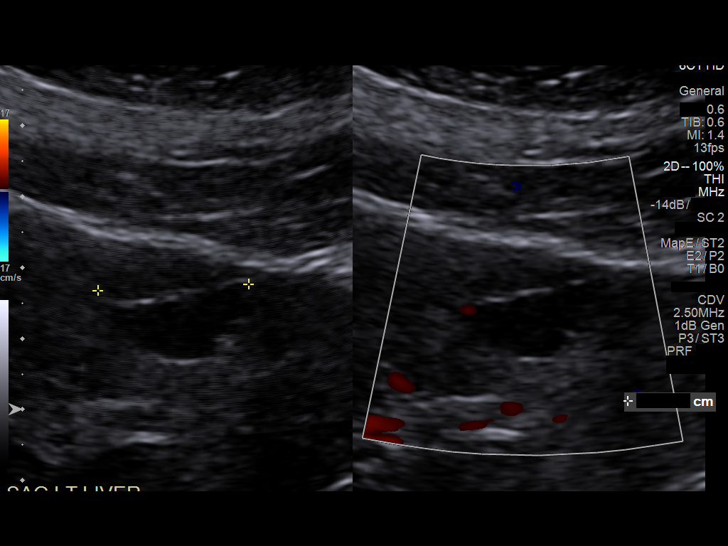
[im 52/52]
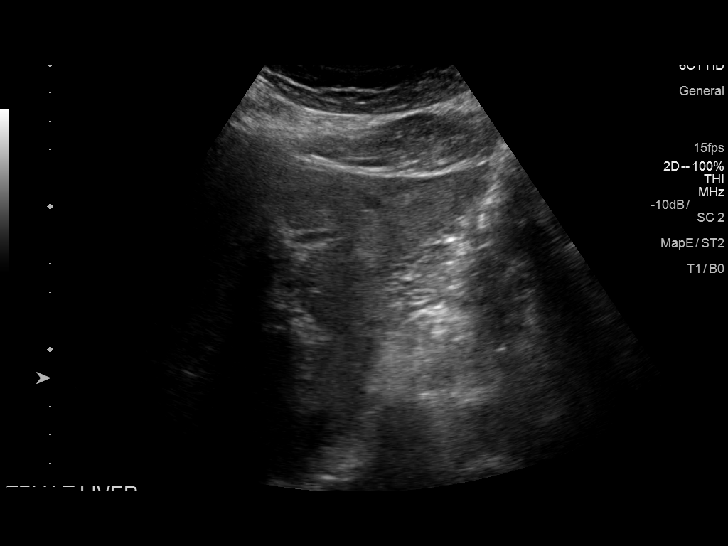

[14 of 25 positions shown; findings below may reference images not displayed]

FINDINGS: Gallbladder:

Well distended with evidence of cholelithiasis. No gallbladder wall
thickening or pericholecystic fluid is noted. A negative sonographic
Murphy sign is elicited.

Common bile duct:

Diameter: 4 mm.

Liver:

Scattered heterogeneity is noted throughout the liver. There is a
1.2 cm hyperechoic lesion within the right lobe of the liver likely
representing small hemangioma. In retrospect this corresponds to a
lesion within the liver have seen on a prior CT of 4885. Few
scattered small cystic lesions are noted in the left lobe also
stable from the prior CT examination. The largest of these measures
1.4 cm in greatest dimension. Portal vein is patent on color Doppler
imaging with normal direction of blood flow towards the liver.
IMPRESSION: Cholelithiasis without complicating factors.

Stable cystic and hyperechoic lesions within the liver as described.

## 2018-05-29 MED FILL — HYDROCHLOROTHIAZIDE 12.5 MG: 12.5 | 90 days supply | Qty: 90 | Fill #3

## 2018-05-29 MED FILL — METOPROLOL SUCCINATE ER 25: 25 | 90 days supply | Qty: 90 | Fill #3

## 2018-05-29 MED FILL — ATORVASTATIN 10 MG TABLET: 10 | 90 days supply | Qty: 90 | Fill #1

## 2018-06-09 ENCOUNTER — Encounter: Payer: 59 | Admitting: Internal Medicine

## 2018-07-14 DIAGNOSIS — H524 Presbyopia: Secondary | ICD-10-CM | POA: Diagnosis not present

## 2018-08-15 ENCOUNTER — Encounter: Payer: Self-pay | Admitting: Internal Medicine

## 2018-08-15 NOTE — Telephone Encounter (Signed)
Documented flu shot.../LMB  

## 2018-08-30 ENCOUNTER — Other Ambulatory Visit: Payer: Self-pay | Admitting: Internal Medicine

## 2018-08-30 MED FILL — HYDROCHLOROTHIAZIDE 12.5 MG: 12.5 | 90 days supply | Qty: 90 | Fill #0

## 2018-08-30 MED FILL — METOPROLOL SUCCINATE ER 25: 25 | 90 days supply | Qty: 90 | Fill #0

## 2018-08-30 MED FILL — ATORVASTATIN 10 MG TABLET: 10 | 90 days supply | Qty: 90 | Fill #2

## 2018-09-04 NOTE — Progress Notes (Deleted)
Subjective:    Patient ID: [name], female    DOB: [birth year], 59 y.o.   MRN: [id number]  HPI The patient is here for follow up.  Hypertension: She is taking her medication daily. She is compliant with a low sodium diet.  She denies chest pain, palpitations, edema, shortness of breath and regular headaches. She is exercising regularly.  She does not monitor her blood pressure at home.    Hyperlipidemia: She is taking her medication daily. She is compliant with a low fat/cholesterol diet. She is exercising regularly. She denies myalgias.   Migraines:    Prediabetes:  She is compliant with a low sugar/carbohydrate diet.  She is exercising regularly.   Medications and allergies reviewed with patient and updated if appropriate.  Patient Active Problem List   Diagnosis Date Noted  . Palpitations 03/06/2018  . Prediabetes 06/09/2017  . Vitamin D deficiency 06/08/2017  . Osteoporosis 03/09/2016  . Abnormal LFTs (liver function tests) 06/13/2015  . Snoring 08/09/2012  . Dyslipidemia 05/07/2011  . Migraine headache 01/10/2009  . Essential hypertension 01/10/2009  . DIVERTICULITIS, HX OF 01/10/2009    Current Outpatient Medications on File Prior to Visit  Medication Sig Dispense Refill  . aspirin-acetaminophen-caffeine (EXCEDRIN MIGRAINE) 250-250-65 MG per tablet Take 2 tablets by mouth every 6 (six) hours as needed.     Marland Kitchen atorvastatin (LIPITOR) 10 MG tablet Take 1 tablet (10 mg total) by mouth daily. 90 tablet 3  . Crisaborole (EUCRISA) 2 % OINT Apply 1 application topically 2 (two) times daily. 60 g 5  . hydrochlorothiazide (MICROZIDE) 12.5 MG capsule TAKE 1 CAPSULE (12.5 MG TOTAL) BY MOUTH DAILY 90 capsule 1  . metoprolol succinate (TOPROL-XL) 25 MG 24 hr tablet TAKE 1 TABLET BY MOUTH DAILY 90 tablet 1  . rizatriptan (MAXALT) 10 MG tablet Take 10 mg by mouth as needed for migraine. May repeat in 2 hours if needed     No current facility-administered medications on file  prior to visit.     Past Medical History:  Diagnosis Date  . ANEMIA-NOS   . Chronic migraine    improved w/ "LBS" herbal  . DIVERTICULITIS, HX OF   . HYPERTENSION   . MENOPAUSAL SYNDROME   . OSTEOPENIA   . SCIATICA, LEFT    resolved s/p l5-s1 fusion 09/2010  . Shingles outbreak 1997   R thoracic outbreak    Past Surgical History:  Procedure Laterality Date  . ABDOMINAL HYSTERECTOMY  2006   [facility]  . APPENDECTOMY  2008  . bladder Tact  2006  . LUMBAR FUSION  09/2010  . Right L5-S1 laminectomy  02/2009    Social History   Socioeconomic History  . Marital status: Single    Spouse name: Not on file  . Number of children: Not on file  . Years of education: Not on file  . Highest education level: Not on file  Occupational History  . Not on file  Social Needs  . Financial resource strain: Not on file  . Food insecurity:    Worry: Not on file    Inability: Not on file  . Transportation needs:    Medical: Not on file    Non-medical: Not on file  Tobacco Use  . Smoking status: Former Smoker    Packs/day: 1.00    Years: 15.00    Pack years: 15.00    Types: Cigarettes    Last attempt to quit: 10/04/1992    Years since quitting: 25.9  .  Smokeless tobacco: Never Used  Substance and Sexual Activity  . Alcohol use: Yes    Comment: glass wine per month  . Drug use: No  . Sexual activity: Not on file  Lifestyle  . Physical activity:    Days per week: Not on file    Minutes per session: Not on file  . Stress: Not on file  Relationships  . Social connections:    Talks on phone: Not on file    Gets together: Not on file    Attends religious service: Not on file    Active member of club or organization: Not on file    Attends meetings of clubs or organizations: Not on file    Relationship status: Not on file  Other Topics Concern  . 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 Hypertension Mother   . Hypothyroidism Mother   . Coronary artery disease Father   . Hypertension Father   . Parkinson's disease Father   . Hypothyroidism Maternal Grandmother   . Coronary artery disease Maternal Grandmother   . Alzheimer's disease Maternal Grandmother [id number]  . Arthritis Paternal Grandmother   . Diabetes Paternal Grandmother   . Coronary artery disease Paternal Grandmother   . Kidney cancer Brother        RCC s/p nephrectomy  . Heart block Brother        PPM in 20's    Review of Systems     Objective:  There were no vitals filed for this visit. BP Readings from Last 3 Encounters:  03/06/18 112/80  07/11/17 112/60  06/08/17 130/84   Wt Readings from Last 3 Encounters:  03/06/18 140 lb (63.5 kg)  07/11/17 140 lb (63.5 kg)  06/08/17 138 lb (62.6 kg)   There is no height or weight on file to calculate BMI.   Physical Exam    Constitutional: Appears well-developed and well-nourished. No distress.  HENT:  Head: Normocephalic and atraumatic.  Neck: Neck supple. No tracheal deviation present. No thyromegaly present.  No cervical lymphadenopathy Cardiovascular: Normal rate, regular rhythm and normal heart sounds.   No murmur heard. No carotid bruit .  No edema Pulmonary/Chest: Effort normal and breath sounds normal. No respiratory distress. No has no wheezes. No rales.  Skin: Skin is warm and dry. Not diaphoretic.  Psychiatric: Normal mood and affect. Behavior is normal.      Assessment & Plan:    See Problem List for Assessment and Plan of chronic medical problems.

## 2018-09-05 ENCOUNTER — Encounter: Payer: Self-pay | Admitting: Internal Medicine

## 2018-09-05 ENCOUNTER — Ambulatory Visit: Payer: 59 | Admitting: Internal Medicine

## 2018-09-05 ENCOUNTER — Ambulatory Visit (INDEPENDENT_AMBULATORY_CARE_PROVIDER_SITE_OTHER): Payer: 59 | Admitting: Internal Medicine

## 2018-09-05 ENCOUNTER — Other Ambulatory Visit (INDEPENDENT_AMBULATORY_CARE_PROVIDER_SITE_OTHER): Payer: 59

## 2018-09-05 VITALS — BP 136/84 | HR 73 | Temp 98.2°F | Resp 14 | Ht 64.0 in | Wt 140.1 lb

## 2018-09-05 DIAGNOSIS — I10 Essential (primary) hypertension: Secondary | ICD-10-CM

## 2018-09-05 DIAGNOSIS — G43109 Migraine with aura, not intractable, without status migrainosus: Secondary | ICD-10-CM | POA: Diagnosis not present

## 2018-09-05 DIAGNOSIS — R7303 Prediabetes: Secondary | ICD-10-CM

## 2018-09-05 DIAGNOSIS — E785 Hyperlipidemia, unspecified: Secondary | ICD-10-CM | POA: Diagnosis not present

## 2018-09-05 LAB — LIPID PANEL
Cholesterol: 203 mg/dL — ABNORMAL HIGH (ref 0–200)
HDL: 47.1 mg/dL (ref >39.00)
NONHDL: 155.82
Total CHOL/HDL Ratio: 4
Triglycerides: 251 mg/dL — ABNORMAL HIGH (ref 0.0–149.0)
VLDL: 50.2 mg/dL — AB (ref 0.0–40.0)

## 2018-09-05 LAB — COMPREHENSIVE METABOLIC PANEL
ALK PHOS: 188 U/L — AB (ref 39–117)
ALT: 113 U/L — ABNORMAL HIGH (ref 0–35)
AST: 49 U/L — ABNORMAL HIGH (ref 0–37)
Albumin: 4.6 g/dL (ref 3.5–5.2)
BUN: 14 mg/dL (ref 6–23)
CO2: 28 meq/L (ref 19–32)
Calcium: 10.4 mg/dL (ref 8.4–10.5)
Chloride: 101 mEq/L (ref 96–112)
Creatinine, Ser: 0.69 mg/dL (ref 0.40–1.20)
GFR: 92.33 mL/min (ref >60.00)
GLUCOSE: 97 mg/dL (ref 70–99)
Potassium: 4.4 mEq/L (ref 3.5–5.1)
Sodium: 137 mEq/L (ref 135–145)
TOTAL PROTEIN: 8 g/dL (ref 6.0–8.3)
Total Bilirubin: 0.4 mg/dL (ref 0.2–1.2)

## 2018-09-05 LAB — LDL CHOLESTEROL, DIRECT: Direct LDL: 122 mg/dL

## 2018-09-05 LAB — HEMOGLOBIN A1C: HEMOGLOBIN A1C: 6.3 % (ref 4.6–6.5)

## 2018-09-05 MED ORDER — ZOSTER VAC RECOMB ADJUVANTED 50 MCG/0.5ML IM SUSR: 0.5000 mL | Freq: Once | INTRAMUSCULAR | 1 refills | Status: AC

## 2018-09-05 NOTE — Assessment & Plan Note (Signed)
BP well controlled Current regimen effective and well tolerated Continue current medications at current doses cmp  

## 2018-09-05 NOTE — Progress Notes (Signed)
Subjective:    Patient ID: [name], female    DOB: 06-13-1961, 57 y.o.   MRN: [id number]  HPI The patient is here for follow up.  Hypertension: She is taking her medication daily. She is compliant with a low sodium diet.  She denies chest pain, palpitations, edema, shortness of breath and regular headaches. She is not exercising regularly.       Hyperlipidemia: She is taking her medication daily. She is compliant with a low fat/cholesterol diet. She is not exercising regularly. She denies myalgias.   Migraines:  She has not had any migarines since she was here last.  When she does have a migraine the medication works well, but she has not needed to take it.  Prediabetes:  She is not compliant with a low sugar/carbohydrate diet.  She is not exercising regularly.  She has not been doing what she needs to be doing and is planning on making some changes at the first of the year.   Medications and allergies reviewed with patient and updated if appropriate.  Patient Active Problem List   Diagnosis Date Noted  . Palpitations 03/06/2018  . Prediabetes 06/09/2017  . Vitamin D deficiency 06/08/2017  . Osteoporosis 03/09/2016  . Abnormal LFTs (liver function tests) 06/13/2015  . Snoring 08/09/2012  . Dyslipidemia 05/07/2011  . Migraine headache 01/10/2009  . Essential hypertension 01/10/2009  . DIVERTICULITIS, HX OF 01/10/2009    Current Outpatient Medications on File Prior to Visit  Medication Sig Dispense Refill  . aspirin-acetaminophen-caffeine (EXCEDRIN MIGRAINE) 250-250-65 MG per tablet Take 2 tablets by mouth every 6 (six) hours as needed.     Marland Kitchen. atorvastatin (LIPITOR) 10 MG tablet Take 1 tablet (10 mg total) by mouth daily. 90 tablet 3  . hydrochlorothiazide (MICROZIDE) 12.5 MG capsule TAKE 1 CAPSULE (12.5 MG TOTAL) BY MOUTH DAILY 90 capsule 1  . metoprolol succinate (TOPROL-XL) 25 MG 24 hr tablet TAKE 1 TABLET BY MOUTH DAILY 90 tablet 1  . rizatriptan (MAXALT) 10 MG  tablet Take 10 mg by mouth as needed for migraine. May repeat in 2 hours if needed     No current facility-administered medications on file prior to visit.     Past Medical History:  Diagnosis Date  . ANEMIA-NOS   . Chronic migraine    improved w/ "LBS" herbal  . DIVERTICULITIS, HX OF   . HYPERTENSION   . MENOPAUSAL SYNDROME   . OSTEOPENIA   . SCIATICA, LEFT    resolved s/p l5-s1 fusion 09/2010  . Shingles outbreak 1997   R thoracic outbreak    Past Surgical History:  Procedure Laterality Date  . ABDOMINAL HYSTERECTOMY  2006   [facility]  . APPENDECTOMY  2008  . bladder Tact  2006  . LUMBAR FUSION  09/2010  . Right L5-S1 laminectomy  02/2009    Social History   Socioeconomic History  . Marital status: Single    Spouse name: Not on file  . Number of children: Not on file  . Years of education: Not on file  . Highest education level: Not on file  Occupational History  . Not on file  Social Needs  . Financial resource strain: Not on file  . Food insecurity:    Worry: Not on file    Inability: Not on file  . Transportation needs:    Medical: Not on file    Non-medical: Not on file  Tobacco Use  . Smoking status: Former Smoker    Packs/day:  1.00    Years: 15.00    Pack years: 15.00    Types: Cigarettes    Last attempt to quit: 10/04/1992    Years since quitting: 25.9  . Smokeless tobacco: Never Used  Substance and Sexual Activity  . Alcohol use: Yes    Comment: glass wine per month  . Drug use: No  . Sexual activity: Not on file  Lifestyle  . Physical activity:    Days per week: Not on file    Minutes per session: Not on file  . Stress: Not on file  Relationships  . Social connections:    Talks on phone: Not on file    Gets together: Not on file    Attends religious service: Not on file    Active member of club or organization: Not on file    Attends meetings of clubs or organizations: Not on file    Relationship status: Not on file  Other Topics  Concern  . 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 Hypertension Mother   . Hypothyroidism Mother   . Coronary artery disease Father   . Hypertension Father   . Parkinson's disease Father   . Hypothyroidism Maternal Grandmother   . Coronary artery disease Maternal Grandmother   . Alzheimer's disease Maternal Grandmother 38  . Arthritis Paternal Grandmother   . Diabetes Paternal Grandmother   . Coronary artery disease Paternal Grandmother   . Kidney cancer Brother        RCC s/p nephrectomy  . Heart block Brother        PPM in 20's    Review of Systems  Constitutional: Negative for chills and fever.  Respiratory: Negative for cough, shortness of breath and wheezing.   Cardiovascular: Negative for chest pain, palpitations and leg swelling.  Neurological: Negative for dizziness, light-headedness and headaches.       Objective:   Vitals:   09/05/18 1447  BP: 136/84  Pulse: 73  Resp: 14  Temp: 98.2 F (36.8 C)  SpO2: 97%   BP Readings from Last 3 Encounters:  09/05/18 136/84  03/06/18 112/80  07/11/17 112/60   Wt Readings from Last 3 Encounters:  09/05/18 140 lb 1.9 oz (63.6 kg)  03/06/18 140 lb (63.5 kg)  07/11/17 140 lb (63.5 kg)   Body mass index is 24.05 kg/m.   Physical Exam    Constitutional: Appears well-developed and well-nourished. No distress.  HENT:  Head: Normocephalic and atraumatic.  Neck: Neck supple. No tracheal deviation present. No thyromegaly present.  No cervical lymphadenopathy Cardiovascular: Normal rate, regular rhythm and normal heart sounds.   No murmur heard. No carotid bruit .  No edema Pulmonary/Chest: Effort normal and breath sounds normal. No respiratory distress. No has no wheezes. No rales.  Skin: Skin is warm and dry. Not diaphoretic.  Psychiatric: Normal mood and affect. Behavior is normal.        Assessment & Plan:    See Problem List for Assessment and Plan of chronic medical problems.

## 2018-09-05 NOTE — Assessment & Plan Note (Signed)
Check a1c Low sugar / carb diet Stressed regular exercise   

## 2018-09-05 NOTE — Assessment & Plan Note (Signed)
Has not had a migraines recently Can use Maxalt as needed since it is effective

## 2018-09-05 NOTE — Assessment & Plan Note (Signed)
Check lipid panel  Continue daily statin Regular exercise and healthy diet encouraged  

## 2018-09-05 NOTE — Patient Instructions (Addendum)
  Tests ordered today. Your results will be released to MyChart (or called to you) after review, usually within 72hours after test completion. If any changes need to be made, you will be notified at that same time.  Medications reviewed and updated.  Changes include :   none      Please followup in 6 months   

## 2018-09-06 ENCOUNTER — Encounter: Payer: Self-pay | Admitting: Internal Medicine

## 2018-09-07 MED FILL — ATORVASTATIN 10 MG TABLET: 10 | 90 days supply | Qty: 90 | Fill #2

## 2018-09-15 ENCOUNTER — Encounter: Payer: Self-pay | Admitting: Internal Medicine

## 2018-12-12 MED FILL — ATORVASTATIN 10 MG TABLET: 10 | 90 days supply | Qty: 90 | Fill #3

## 2018-12-12 MED FILL — METOPROLOL SUCCINATE ER 25: 25 | 90 days supply | Qty: 90 | Fill #1

## 2018-12-12 MED FILL — HYDROCHLOROTHIAZIDE 12.5 MG: 12.5 | 90 days supply | Qty: 90 | Fill #1

## 2019-03-13 ENCOUNTER — Other Ambulatory Visit: Payer: Self-pay | Admitting: Internal Medicine

## 2019-03-13 MED FILL — HYDROCHLOROTHIAZIDE 12.5 MG: 12.5 | 90 days supply | Qty: 90 | Fill #0

## 2019-03-13 MED FILL — ATORVASTATIN 10 MG TABLET: 10 | 90 days supply | Qty: 90 | Fill #0

## 2019-03-13 MED FILL — METOPROLOL SUCCINATE ER 25: 25 | 90 days supply | Qty: 90 | Fill #0

## 2019-04-09 NOTE — Progress Notes (Signed)
Subjective:    Patient ID: [name], female    DOB: December 27, 1960, 58 y.o.   MRN: [id number]  HPI The patient is here for follow up.  She is not exercising regularly.     She is now working at [employer].  She did put in her notice of retirement.    Hypertension: She is taking her medication daily. She is compliant with a low sodium diet.  She denies chest pain, palpitations, edema, shortness of breath and regular headaches. She does not monitor her blood pressure at home.    Hyperlipidemia: She is taking her medication daily. She is compliant with a low fat/cholesterol diet. She denies myalgias.   Prediabetes:  She is fairly compliant with a low sugar/carbohydrate diet.  She is not exercising regularly.  Migraines: she has not had any migraines since she was here last.     Elevated LFTs:  She rarely drinks any alcohol.  She denies GI symptoms.      Medications and allergies reviewed with patient and updated if appropriate.  Patient Active Problem List   Diagnosis Date Noted  . Palpitations 03/06/2018  . Prediabetes 06/09/2017  . Vitamin D deficiency 06/08/2017  . Osteoporosis 03/09/2016  . Abnormal LFTs (liver function tests) 06/13/2015  . Snoring 08/09/2012  . Dyslipidemia 05/07/2011  . Migraine headache 01/10/2009  . Essential hypertension 01/10/2009  . DIVERTICULITIS, HX OF 01/10/2009    Current Outpatient Medications on File Prior to Visit  Medication Sig Dispense Refill  . aspirin-acetaminophen-caffeine (EXCEDRIN MIGRAINE) 250-250-65 MG per tablet Take 2 tablets by mouth every 6 (six) hours as needed.     Marland Kitchen. atorvastatin (LIPITOR) 10 MG tablet TAKE 1 TABLET BY MOUTH ONCE DAILY 90 tablet 1  . cholecalciferol (VITAMIN D3) 25 MCG (1000 UT) tablet Take 1,000 Units by mouth daily. Every other day    . hydrochlorothiazide (MICROZIDE) 12.5 MG capsule TAKE 1 CAPSULE (12.5 MG TOTAL) BY MOUTH DAILY 90 capsule 1  . metoprolol succinate (TOPROL-XL) 25 MG 24 hr  tablet TAKE 1 TABLET BY MOUTH DAILY 90 tablet 1   No current facility-administered medications on file prior to visit.     Past Medical History:  Diagnosis Date  . ANEMIA-NOS   . Chronic migraine    improved w/ "LBS" herbal  . DIVERTICULITIS, HX OF   . HYPERTENSION   . MENOPAUSAL SYNDROME   . OSTEOPENIA   . SCIATICA, LEFT    resolved s/p l5-s1 fusion 09/2010  . Shingles outbreak 1997   R thoracic outbreak    Past Surgical History:  Procedure Laterality Date  . ABDOMINAL HYSTERECTOMY  2006   [facility]  . APPENDECTOMY  2008  . bladder Tact  2006  . LUMBAR FUSION  09/2010  . Right L5-S1 laminectomy  02/2009    Social History   Socioeconomic History  . Marital status: Single    Spouse name: Not on file  . Number of children: Not on file  . Years of education: Not on file  . Highest education level: Not on file  Occupational History  . Not on file  Social Needs  . Financial resource strain: Not on file  . Food insecurity    Worry: Not on file    Inability: Not on file  . Transportation needs    Medical: Not on file    Non-medical: Not on file  Tobacco Use  . Smoking status: Former Smoker    Packs/day: 1.00    Years: 15.00  Pack years: 15.00    Types: Cigarettes    Quit date: 10/04/1992    Years since quitting: 26.5  . Smokeless tobacco: Never Used  Substance and Sexual Activity  . Alcohol use: Yes    Comment: 1 glass of wine twice a year  . Drug use: No  . Sexual activity: Not on file  Lifestyle  . Physical activity    Days per week: Not on file    Minutes per session: Not on file  . Stress: Not on file  Relationships  . Social Herbalist on phone: Not on file    Gets together: Not on file    Attends religious service: Not on file    Active member of club or organization: Not on file    Attends meetings of clubs or organizations: Not on file    Relationship status: Not on file  Other Topics Concern  . 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 Hypertension Mother   . Hypothyroidism Mother   . Coronary artery disease Father   . Hypertension Father   . Parkinson's disease Father   . Hypothyroidism Maternal Grandmother   . Coronary artery disease Maternal Grandmother   . Alzheimer's disease Maternal Grandmother 85  . Arthritis Paternal Grandmother   . Diabetes Paternal Grandmother   . Coronary artery disease Paternal Grandmother   . Kidney cancer Brother        RCC s/p nephrectomy  . Heart block Brother        PPM in 20's    Review of Systems  Constitutional: Negative for chills and fever.  Respiratory: Negative for cough, shortness of breath and wheezing.   Cardiovascular: Negative for chest pain, palpitations and leg swelling.  Gastrointestinal: Negative for abdominal pain, blood in stool, constipation, diarrhea and nausea.       No gerd  Neurological: Negative for light-headedness and headaches.       Objective:   Vitals:   04/10/19 1356  BP: 132/84  Pulse: 70  Resp: 16  Temp: 99 F (37.2 C)  SpO2: 97%   BP Readings from Last 3 Encounters:  04/10/19 132/84  09/05/18 136/84  03/06/18 112/80   Wt Readings from Last 3 Encounters:  04/10/19 142 lb (64.4 kg)  09/05/18 140 lb 1.9 oz (63.6 kg)  03/06/18 140 lb (63.5 kg)   Body mass index is 24.37 kg/m.   Physical Exam    Constitutional: Appears well-developed and well-nourished. No distress.  HENT:  Head: Normocephalic and atraumatic.  Neck: Neck supple. No tracheal deviation present. No thyromegaly present.  No cervical lymphadenopathy Cardiovascular: Normal rate, regular rhythm and normal heart sounds.   No murmur heard. No carotid bruit .  No edema Pulmonary/Chest: Effort normal and breath sounds normal. No respiratory distress. No has no wheezes. No rales.  Abdomen: soft, NT, ND Skin: Skin is warm and dry.  Not diaphoretic.  Psychiatric: Normal mood and affect. Behavior is normal.      Assessment & Plan:    See Problem List for Assessment and Plan of chronic medical problems.

## 2019-04-10 ENCOUNTER — Encounter: Payer: Self-pay | Admitting: Internal Medicine

## 2019-04-10 ENCOUNTER — Other Ambulatory Visit (INDEPENDENT_AMBULATORY_CARE_PROVIDER_SITE_OTHER): Payer: 59

## 2019-04-10 ENCOUNTER — Other Ambulatory Visit: Payer: Self-pay

## 2019-04-10 ENCOUNTER — Ambulatory Visit (INDEPENDENT_AMBULATORY_CARE_PROVIDER_SITE_OTHER): Payer: 59 | Admitting: Internal Medicine

## 2019-04-10 VITALS — BP 132/84 | HR 70 | Temp 99.0°F | Resp 16 | Ht 64.0 in | Wt 142.0 lb

## 2019-04-10 DIAGNOSIS — E785 Hyperlipidemia, unspecified: Secondary | ICD-10-CM

## 2019-04-10 DIAGNOSIS — I10 Essential (primary) hypertension: Secondary | ICD-10-CM

## 2019-04-10 DIAGNOSIS — R7303 Prediabetes: Secondary | ICD-10-CM

## 2019-04-10 DIAGNOSIS — E559 Vitamin D deficiency, unspecified: Secondary | ICD-10-CM

## 2019-04-10 DIAGNOSIS — R7989 Other specified abnormal findings of blood chemistry: Secondary | ICD-10-CM

## 2019-04-10 DIAGNOSIS — G43109 Migraine with aura, not intractable, without status migrainosus: Secondary | ICD-10-CM | POA: Diagnosis not present

## 2019-04-10 DIAGNOSIS — R945 Abnormal results of liver function studies: Secondary | ICD-10-CM | POA: Diagnosis not present

## 2019-04-10 LAB — COMPREHENSIVE METABOLIC PANEL
ALT: 59 U/L — ABNORMAL HIGH (ref 0–35)
AST: 40 U/L — ABNORMAL HIGH (ref 0–37)
Albumin: 4.6 g/dL (ref 3.5–5.2)
Alkaline Phosphatase: 143 U/L — ABNORMAL HIGH (ref 39–117)
BUN: 11 mg/dL (ref 6–23)
CO2: 28 mEq/L (ref 19–32)
Calcium: 9.4 mg/dL (ref 8.4–10.5)
Chloride: 102 mEq/L (ref 96–112)
Creatinine, Ser: 0.7 mg/dL (ref 0.40–1.20)
GFR: 85.27 mL/min (ref >60.00)
Glucose, Bld: 125 mg/dL — ABNORMAL HIGH (ref 70–99)
Potassium: 4.4 mEq/L (ref 3.5–5.1)
Sodium: 137 mEq/L (ref 135–145)
Total Bilirubin: 0.4 mg/dL (ref 0.2–1.2)
Total Protein: 7.8 g/dL (ref 6.0–8.3)

## 2019-04-10 LAB — CBC WITH DIFFERENTIAL/PLATELET
Basophils Absolute: 0.2 10*3/uL — ABNORMAL HIGH (ref 0.0–0.1)
Basophils Relative: 2.3 % (ref 0.0–3.0)
Eosinophils Absolute: 0.4 10*3/uL (ref 0.0–0.7)
Eosinophils Relative: 5 % (ref 0.0–5.0)
HCT: 42.2 % (ref 36.0–46.0)
Hemoglobin: 14.4 g/dL (ref 12.0–15.0)
Lymphocytes Relative: 35.8 % (ref 12.0–46.0)
Lymphs Abs: 2.5 10*3/uL (ref 0.7–4.0)
MCHC: 34.3 g/dL (ref 30.0–36.0)
MCV: 89.1 fl (ref 78.0–100.0)
Monocytes Absolute: 0.6 10*3/uL (ref 0.1–1.0)
Monocytes Relative: 8.6 % (ref 3.0–12.0)
Neutro Abs: 3.4 10*3/uL (ref 1.4–7.7)
Neutrophils Relative %: 48.3 % (ref 43.0–77.0)
Platelets: 284 10*3/uL (ref 150.0–400.0)
RBC: 4.73 Mil/uL (ref 3.87–5.11)
RDW: 12.7 % (ref 11.5–15.5)
WBC: 7.1 10*3/uL (ref 4.0–10.5)

## 2019-04-10 LAB — LIPID PANEL
Cholesterol: 177 mg/dL (ref 0–200)
HDL: 45.8 mg/dL (ref >39.00)
LDL Cholesterol: 92 mg/dL (ref 0–99)
NonHDL: 131.25
Total CHOL/HDL Ratio: 4
Triglycerides: 198 mg/dL — ABNORMAL HIGH (ref 0.0–149.0)
VLDL: 39.6 mg/dL (ref 0.0–40.0)

## 2019-04-10 LAB — HEMOGLOBIN A1C: Hgb A1c MFr Bld: 6.1 % (ref 4.6–6.5)

## 2019-04-10 LAB — VITAMIN D 25 HYDROXY (VIT D DEFICIENCY, FRACTURES): VITD: 27.95 ng/mL — ABNORMAL LOW (ref 30.00–100.00)

## 2019-04-10 NOTE — Assessment & Plan Note (Signed)
Taking vitamin d daily Check level 

## 2019-04-10 NOTE — Assessment & Plan Note (Signed)
Check a1c Low sugar / carb diet Stressed regular exercise   

## 2019-04-10 NOTE — Assessment & Plan Note (Signed)
BP well controlled Current regimen effective and well tolerated Continue current medications at current doses cmp  

## 2019-04-10 NOTE — Assessment & Plan Note (Signed)
No migraines since her last visit

## 2019-04-10 NOTE — Assessment & Plan Note (Signed)
Check lipid panel  Continue daily statin Regular exercise and healthy diet encouraged  

## 2019-04-10 NOTE — Assessment & Plan Note (Signed)
[place] 2018 showed nothing concerning Likely related to fatty liver Cmp, cbc

## 2019-04-10 NOTE — Patient Instructions (Signed)
  Tests ordered today. Your results will be released to MyChart (or called to you) after review.  If any changes need to be made, you will be notified at that same time.    Medications reviewed and updated.  Changes include :   none     Please followup in 6 months   

## 2019-04-12 ENCOUNTER — Encounter: Payer: Self-pay | Admitting: Internal Medicine

## 2019-06-25 MED FILL — ATORVASTATIN 10 MG TABLET: 10 | 90 days supply | Qty: 90 | Fill #1

## 2019-06-25 MED FILL — HYDROCHLOROTHIAZIDE 12.5 MG: 12.5 | 90 days supply | Qty: 90 | Fill #1

## 2019-06-25 MED FILL — METOPROLOL SUCCINATE ER 25: 25 | 90 days supply | Qty: 90 | Fill #1

## 2019-09-10 ENCOUNTER — Other Ambulatory Visit: Payer: Self-pay | Admitting: Internal Medicine

## 2019-09-10 MED FILL — METOPROLOL SUCCINATE ER 25: 25 | 90 days supply | Qty: 90 | Fill #0

## 2019-09-10 MED FILL — ATORVASTATIN 10 MG TABLET: 10 | 90 days supply | Qty: 90 | Fill #0

## 2019-09-10 MED FILL — HYDROCHLOROTHIAZIDE 12.5 MG: 12.5 | 90 days supply | Qty: 90 | Fill #0

## 2019-10-17 ENCOUNTER — Ambulatory Visit (INDEPENDENT_AMBULATORY_CARE_PROVIDER_SITE_OTHER): Payer: 59 | Admitting: Internal Medicine

## 2019-10-17 ENCOUNTER — Encounter: Payer: Self-pay | Admitting: Internal Medicine

## 2019-10-17 ENCOUNTER — Other Ambulatory Visit: Payer: Self-pay

## 2019-10-17 VITALS — BP 136/82 | HR 76 | Temp 98.3°F | Resp 16 | Ht 64.0 in | Wt 142.8 lb

## 2019-10-17 DIAGNOSIS — E559 Vitamin D deficiency, unspecified: Secondary | ICD-10-CM | POA: Diagnosis not present

## 2019-10-17 DIAGNOSIS — I10 Essential (primary) hypertension: Secondary | ICD-10-CM | POA: Diagnosis not present

## 2019-10-17 DIAGNOSIS — G43109 Migraine with aura, not intractable, without status migrainosus: Secondary | ICD-10-CM

## 2019-10-17 DIAGNOSIS — R7303 Prediabetes: Secondary | ICD-10-CM | POA: Diagnosis not present

## 2019-10-17 DIAGNOSIS — E785 Hyperlipidemia, unspecified: Secondary | ICD-10-CM | POA: Diagnosis not present

## 2019-10-17 DIAGNOSIS — M810 Age-related osteoporosis without current pathological fracture: Secondary | ICD-10-CM

## 2019-10-17 LAB — LIPID PANEL
Cholesterol: 174 mg/dL (ref 0–200)
HDL: 44 mg/dL (ref >39.00)
NonHDL: 130.44
Total CHOL/HDL Ratio: 4
Triglycerides: 222 mg/dL — ABNORMAL HIGH (ref 0.0–149.0)
VLDL: 44.4 mg/dL — ABNORMAL HIGH (ref 0.0–40.0)

## 2019-10-17 LAB — COMPREHENSIVE METABOLIC PANEL
ALT: 59 U/L — ABNORMAL HIGH (ref 0–35)
AST: 42 U/L — ABNORMAL HIGH (ref 0–37)
Albumin: 4.2 g/dL (ref 3.5–5.2)
Alkaline Phosphatase: 143 U/L — ABNORMAL HIGH (ref 39–117)
BUN: 13 mg/dL (ref 6–23)
CO2: 25 mEq/L (ref 19–32)
Calcium: 9.3 mg/dL (ref 8.4–10.5)
Chloride: 104 mEq/L (ref 96–112)
Creatinine, Ser: 0.59 mg/dL (ref 0.40–1.20)
GFR: 103.68 mL/min (ref >60.00)
Glucose, Bld: 121 mg/dL — ABNORMAL HIGH (ref 70–99)
Potassium: 3.7 mEq/L (ref 3.5–5.1)
Sodium: 137 mEq/L (ref 135–145)
Total Bilirubin: 0.3 mg/dL (ref 0.2–1.2)
Total Protein: 7.5 g/dL (ref 6.0–8.3)

## 2019-10-17 LAB — LDL CHOLESTEROL, DIRECT: Direct LDL: 108 mg/dL

## 2019-10-17 NOTE — Assessment & Plan Note (Signed)
Taking vitamin D We will check level

## 2019-10-17 NOTE — Assessment & Plan Note (Signed)
Chronic Check lipid panel  Continue daily statin Regular exercise and healthy diet encouraged  

## 2019-10-17 NOTE — Assessment & Plan Note (Signed)
Hormones have for the most part resolved Can take Excedrin Migraine if needed

## 2019-10-17 NOTE — Assessment & Plan Note (Signed)
Chronic Check a1c Low sugar / carb diet Stressed regular exercise  

## 2019-10-17 NOTE — Progress Notes (Signed)
Subjective:    Patient ID: [name], female    DOB: 01-09-1961, 59 y.o.   MRN: [id number]  HPI The patient is here for follow up of their chronic medical problems, including htn, hyperlipidemia, prediabetes, migraines, elevated lfts.  She did not retire since she was here last.  She is still working a few days a week.  She does have more freedom to spend time with her parents and is also doing some research.  She anticipates officially retiring in several months.  She denies any changes in her history.   She is taking all of her medications as prescribed.   She is not exercising regularly.  She plans on starting yoga at some point soon.  She has no concerns.  Medications and allergies reviewed with patient and updated if appropriate.  Patient Active Problem List   Diagnosis Date Noted  . Palpitations 03/06/2018  . Prediabetes 06/09/2017  . Vitamin D deficiency 06/08/2017  . Osteoporosis 03/09/2016  . Abnormal LFTs (liver function tests) 06/13/2015  . Snoring 08/09/2012  . Dyslipidemia 05/07/2011  . Migraine headache 01/10/2009  . Essential hypertension 01/10/2009  . DIVERTICULITIS, HX OF 01/10/2009    Current Outpatient Medications on File Prior to Visit  Medication Sig Dispense Refill  . aspirin-acetaminophen-caffeine (EXCEDRIN MIGRAINE) 250-250-65 MG per tablet Take 2 tablets by mouth every 6 (six) hours as needed.     Marland Kitchen atorvastatin (LIPITOR) 10 MG tablet TAKE 1 TABLET BY MOUTH ONCE DAILY 90 tablet 1  . cholecalciferol (VITAMIN D3) 25 MCG (1000 UT) tablet Take 1,000 Units by mouth daily. Every other day    . hydrochlorothiazide (MICROZIDE) 12.5 MG capsule TAKE 1 CAPSULE (12.5 MG TOTAL) BY MOUTH DAILY 90 capsule 1  . metoprolol succinate (TOPROL-XL) 25 MG 24 hr tablet TAKE 1 TABLET BY MOUTH DAILY 90 tablet 1   No current facility-administered medications on file prior to visit.    Past Medical History:  Diagnosis Date  . ANEMIA-NOS   . Chronic migraine    improved w/ "LBS" herbal  . DIVERTICULITIS, HX OF   . HYPERTENSION   . MENOPAUSAL SYNDROME   . OSTEOPENIA   . SCIATICA, LEFT    resolved s/p l5-s1 fusion 09/2010  . Shingles outbreak 1997   R thoracic outbreak    Past Surgical History:  Procedure Laterality Date  . ABDOMINAL HYSTERECTOMY  2006   [facility]  . APPENDECTOMY  2008  . bladder Tact  2006  . LUMBAR FUSION  09/2010  . Right L5-S1 laminectomy  02/2009    Social History   Socioeconomic History  . Marital status: Single    Spouse name: Not on file  . Number of children: Not on file  . Years of education: Not on file  . Highest education level: Not on file  Occupational History  . Not on file  Tobacco Use  . Smoking status: Former Smoker    Packs/day: 1.00    Years: 15.00    Pack years: 15.00    Types: Cigarettes    Quit date: 10/04/1992    Years since quitting: 27.0  . Smokeless tobacco: Never Used  Substance and Sexual Activity  . Alcohol use: Yes    Comment: 1 glass of wine twice a year  . Drug use: No  . Sexual activity: Not on file  Other Topics Concern  . 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 Difficulty of Paying Living Expenses: Not on file  Food Insecurity:   . Worried About Programme researcher, broadcasting/film/video in the Last Year: Not on file  . Ran Out of Food in the Last Year: Not on file  Transportation Needs:   . Lack of Transportation (Medical): Not on file  . Lack of Transportation (Non-Medical): Not on file  Physical Activity:   . Days of Exercise per Week: Not on file  . Minutes of Exercise per Session: Not on file  Stress:   . Feeling of Stress : Not on file  Social Connections:   . Frequency of Communication with Friends and Family: Not on file  . Frequency of Social Gatherings with Friends and Family: Not on file  . Attends Religious Services: Not on  file  . Active Member of Clubs or Organizations: Not on file  . Attends Banker Meetings: Not on file  . Marital Status: Not on file    Family History  Problem Relation Age of Onset  . Hypertension Mother   . Hypothyroidism Mother   . Coronary artery disease Father   . Hypertension Father   . Parkinson's disease Father   . Hypothyroidism Maternal Grandmother   . Coronary artery disease Maternal Grandmother   . Alzheimer's disease Maternal Grandmother 50  . Arthritis Paternal Grandmother   . Diabetes Paternal Grandmother   . Coronary artery disease Paternal Grandmother   . Kidney cancer Brother        RCC s/p nephrectomy  . Heart block Brother        PPM in 20's    Review of Systems  Constitutional: Negative for chills and fever.  Respiratory: Negative for cough, shortness of breath and wheezing.   Cardiovascular: Negative for chest pain, palpitations and leg swelling.  Neurological: Negative for light-headedness and headaches.       Objective:   Vitals:   10/17/19 1354  BP: 136/82  Pulse: 76  Resp: 16  Temp: 98.3 F (36.8 C)  SpO2: 95%   BP Readings from Last 3 Encounters:  10/17/19 136/82  04/10/19 132/84  09/05/18 136/84   Wt Readings from Last 3 Encounters:  10/17/19 142 lb 12.8 oz (64.8 kg)  04/10/19 142 lb (64.4 kg)  09/05/18 140 lb 1.9 oz (63.6 kg)   Body mass index is 24.51 kg/m.   Physical Exam    Constitutional: Appears well-developed and well-nourished. No distress.  HENT:  Head: Normocephalic and atraumatic.  Neck: Neck supple. No tracheal deviation present. No thyromegaly present.  No cervical lymphadenopathy Cardiovascular: Normal rate, regular rhythm and normal heart sounds.   No murmur heard. No carotid bruit .  No edema Pulmonary/Chest: Effort normal and breath sounds normal. No respiratory distress. No has no wheezes. No rales.  Skin: Skin is warm and dry. Not diaphoretic.  Psychiatric: Normal mood and affect. Behavior  is normal.      Assessment & Plan:    See Problem List for Assessment and Plan of chronic medical problems.    This visit occurred during the SARS-CoV-2 public health emergency.  Safety protocols were in place, including screening questions prior to the visit, additional usage of staff PPE, and extensive cleaning of exam room while observing appropriate contact time as indicated for disinfecting solutions.

## 2019-10-17 NOTE — Assessment & Plan Note (Signed)
Deferred DEXA scan at this time Will reevaluate in 6 months Will try to start regular exercise Taking vitamin D

## 2019-10-17 NOTE — Assessment & Plan Note (Signed)
Chronic BP well controlled Current regimen effective and well tolerated Continue current medications at current doses cmp  

## 2019-10-17 NOTE — Patient Instructions (Addendum)
°  Blood work was ordered.   ° ° °Medications reviewed and updated.  Changes include :   none ° ° ° °Please followup in 6 months ° ° °

## 2019-10-18 LAB — HEMOGLOBIN A1C: Hgb A1c MFr Bld: 6.2 % (ref 4.6–6.5)

## 2019-10-18 LAB — VITAMIN D 25 HYDROXY (VIT D DEFICIENCY, FRACTURES): VITD: 30.46 ng/mL (ref 30.00–100.00)

## 2019-10-19 ENCOUNTER — Encounter: Payer: Self-pay | Admitting: Internal Medicine

## 2019-11-30 MED FILL — METOPROLOL SUCCINATE ER 25: 25 | 90 days supply | Qty: 90 | Fill #1

## 2019-11-30 MED FILL — ATORVASTATIN 10 MG TABLET: 10 | 90 days supply | Qty: 90 | Fill #1

## 2019-12-14 DIAGNOSIS — H43393 Other vitreous opacities, bilateral: Secondary | ICD-10-CM | POA: Diagnosis not present

## 2019-12-14 DIAGNOSIS — H524 Presbyopia: Secondary | ICD-10-CM | POA: Diagnosis not present

## 2019-12-19 MED FILL — HYDROCHLOROTHIAZIDE 12.5 MG: 12.5 | 90 days supply | Qty: 90 | Fill #1

## 2019-12-19 MED FILL — ATORVASTATIN 10 MG TABLET: 10 | 90 days supply | Qty: 90 | Fill #1

## 2019-12-19 MED FILL — METOPROLOL SUCCINATE ER 25: 25 | 90 days supply | Qty: 90 | Fill #1

## 2020-03-17 ENCOUNTER — Other Ambulatory Visit: Payer: Self-pay | Admitting: Internal Medicine

## 2020-03-17 MED FILL — ATORVASTATIN CALCIUM 10 MG: 10 | 90 days supply | Qty: 90 | Fill #0

## 2020-03-17 MED FILL — METOPROLOL SUCCINATE ER 25: 25 | 90 days supply | Qty: 90 | Fill #0

## 2020-04-13 ENCOUNTER — Encounter: Payer: Self-pay | Admitting: Internal Medicine

## 2020-04-18 ENCOUNTER — Encounter: Payer: 59 | Admitting: Internal Medicine

## 2020-05-01 ENCOUNTER — Encounter: Payer: 59 | Admitting: Internal Medicine

## 2020-05-19 NOTE — Progress Notes (Signed)
Subjective:    Patient ID: [name], female    DOB: [birth year], 59 y.o.   MRN: [id number]  HPI She is here for a physical exam.     Medications and allergies reviewed with patient and updated if appropriate.  Patient Active Problem List   Diagnosis Date Noted  . Palpitations 03/06/2018  . Prediabetes 06/09/2017  . Vitamin D deficiency 06/08/2017  . Osteoporosis 03/09/2016  . Abnormal LFTs (liver function tests) 06/13/2015  . Snoring 08/09/2012  . Dyslipidemia 05/07/2011  . Migraine headache 01/10/2009  . Essential hypertension 01/10/2009  . DIVERTICULITIS, HX OF 01/10/2009    Current Outpatient Medications on File Prior to Visit  Medication Sig Dispense Refill  . aspirin-acetaminophen-caffeine (EXCEDRIN MIGRAINE) 250-250-65 MG per tablet Take 2 tablets by mouth every 6 (six) hours as needed.     Marland Kitchen atorvastatin (LIPITOR) 10 MG tablet TAKE 1 TABLET BY MOUTH ONCE DAILY 90 tablet 1  . cholecalciferol (VITAMIN D3) 25 MCG (1000 UT) tablet Take 1,000 Units by mouth daily. Every other day    . hydrochlorothiazide (MICROZIDE) 12.5 MG capsule TAKE 1 CAPSULE (12.5 MG TOTAL) BY MOUTH DAILY 90 capsule 1  . metoprolol succinate (TOPROL-XL) 25 MG 24 hr tablet TAKE 1 TABLET BY MOUTH DAILY 90 tablet 1   No current facility-administered medications on file prior to visit.    Past Medical History:  Diagnosis Date  . ANEMIA-NOS   . Chronic migraine    improved w/ "LBS" herbal  . DIVERTICULITIS, HX OF   . HYPERTENSION   . MENOPAUSAL SYNDROME   . OSTEOPENIA   . SCIATICA, LEFT    resolved s/p l5-s1 fusion 09/2010  . Shingles outbreak 1997   R thoracic outbreak    Past Surgical History:  Procedure Laterality Date  . ABDOMINAL HYSTERECTOMY  2006   [facility]  . APPENDECTOMY  2008  . bladder Tact  2006  . LUMBAR FUSION  09/2010  . Right L5-S1 laminectomy  02/2009    Social History   Socioeconomic History  . Marital status: Single    Spouse name: Not on file  . Number of  children: Not on file  . Years of education: Not on file  . Highest education level: Not on file  Occupational History  . Not on file  Tobacco Use  . Smoking status: Former Smoker    Packs/day: 1.00    Years: 15.00    Pack years: 15.00    Types: Cigarettes    Quit date: 10/04/1992    Years since quitting: 27.6  . Smokeless tobacco: Never Used  Substance and Sexual Activity  . Alcohol use: Yes    Comment: 1 glass of wine twice a year  . Drug use: No  . Sexual activity: Not on file  Other Topics Concern  . 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 Difficulty of Paying Living Expenses:   Food Insecurity:   . Worried About Programme researcher, broadcasting/film/video in the Last Year:   . Barista in the Last Year:   Transportation Needs:   . Freight forwarder (Medical):   Marland Kitchen Lack of Transportation (Non-Medical):   Physical Activity:   . Days of Exercise per Week:   . Minutes of Exercise per Session:   Stress:   .  Feeling of Stress :   Social Connections:   . Frequency of Communication with Friends and Family:   . Frequency of Social Gatherings with Friends and Family:   . Attends Religious Services:   . Active Member of Clubs or Organizations:   . Attends Banker Meetings:   Marland Kitchen Marital Status:     Family History  Problem Relation Age of Onset  . Hypertension Mother   . Hypothyroidism Mother   . Coronary artery disease Father   . Hypertension Father   . Parkinson's disease Father   . Hypothyroidism Maternal Grandmother   . Coronary artery disease Maternal Grandmother   . Alzheimer's disease Maternal Grandmother 75  . Arthritis Paternal Grandmother   . Diabetes Paternal Grandmother   . Coronary artery disease Paternal Grandmother   . Kidney cancer Brother        RCC s/p nephrectomy  . Heart block Brother        PPM in  20's    Review of Systems  Constitutional: Negative for chills and fever.  Eyes: Negative for visual disturbance.  Respiratory: Negative for cough, shortness of breath and wheezing.   Cardiovascular: Negative for chest pain, palpitations and leg swelling.  Gastrointestinal: Negative for abdominal pain, blood in stool, constipation, diarrhea and nausea.  Genitourinary: Negative for dysuria and hematuria.  Musculoskeletal: Negative for arthralgias and back pain.  Skin: Negative for rash.  Neurological: Negative for dizziness, light-headedness and headaches.  Psychiatric/Behavioral: Negative for dysphoric mood. The patient is not nervous/anxious.        Objective:   Vitals:   05/20/20 1259  BP: 128/70  Pulse: 78  Temp: 98 F (36.7 C)  SpO2: 99%   Filed Weights   05/20/20 1259  Weight: 130 lb (59 kg)   Body mass index is 22.31 kg/m.  BP Readings from Last 3 Encounters:  05/20/20 128/70  10/17/19 136/82  04/10/19 132/84    Wt Readings from Last 3 Encounters:  05/20/20 130 lb (59 kg)  10/17/19 142 lb 12.8 oz (64.8 kg)  04/10/19 142 lb (64.4 kg)     Physical Exam Constitutional: She appears well-developed and well-nourished. No distress.  HENT:  Head: Normocephalic and atraumatic.  Right Ear: External ear normal. Normal ear canal and TM Left Ear: External ear normal.  Normal ear canal and TM Mouth/Throat: Oropharynx is clear and moist.  Eyes: Conjunctivae and EOM are normal.  Neck: Neck supple. No tracheal deviation present. No thyromegaly present.  No carotid bruit  Cardiovascular: Normal rate, regular rhythm and normal heart sounds.   No murmur heard.  No edema. Pulmonary/Chest: Effort normal and breath sounds normal. No respiratory distress. She has no wheezes. She has no rales.  Breast: deferred   Abdominal: Soft. She exhibits no distension. There is no tenderness.  Lymphadenopathy: She has no cervical adenopathy.  Skin: Skin is warm and dry. She is not  diaphoretic.  Psychiatric: She has a normal mood and affect. Her behavior is normal.        Assessment & Plan:   Physical exam: Screening blood work    ordered Immunizations  Discussed shingrix cologuard - due - ordered Mammogram  Due - will schedule Gyn  n/a Dexa   Due - deferred  - does not want to take any medication Eye exams  Up to date  Exercise  Regular - walking Weight  Normal BMI - working on weight loss Substance abuse  none  See Problem List for Assessment and Plan of  chronic medical problems.   This visit occurred during the SARS-CoV-2 public health emergency.  Safety protocols were in place, including screening questions prior to the visit, additional usage of staff PPE, and extensive cleaning of exam room while observing appropriate contact time as indicated for disinfecting solutions.

## 2020-05-19 NOTE — Patient Instructions (Addendum)
Blood work was ordered.    All other Health Maintenance issues reviewed.   All recommended immunizations and age-appropriate screenings are up-to-date or discussed.  No immunization administered today.   Medications reviewed and updated.  Changes include :   none  Your prescription(s) have been submitted to your pharmacy. Please take as directed and contact our office if you believe you are having problem(s) with the medication(s).    Please followup in 1 year    Health Maintenance, Female Adopting a healthy lifestyle and getting preventive care are important in promoting health and wellness. Ask your health care provider about:  The right schedule for you to have regular tests and exams.  Things you can do on your own to prevent diseases and keep yourself healthy. What should I know about diet, weight, and exercise? Eat a healthy diet   Eat a diet that includes plenty of vegetables, fruits, low-fat dairy products, and lean protein.  Do not eat a lot of foods that are high in solid fats, added sugars, or sodium. Maintain a healthy weight Body mass index (BMI) is used to identify weight problems. It estimates body fat based on height and weight. Your health care provider can help determine your BMI and help you achieve or maintain a healthy weight. Get regular exercise Get regular exercise. This is one of the most important things you can do for your health. Most adults should:  Exercise for at least 150 minutes each week. The exercise should increase your heart rate and make you sweat (moderate-intensity exercise).  Do strengthening exercises at least twice a week. This is in addition to the moderate-intensity exercise.  Spend less time sitting. Even light physical activity can be beneficial. Watch cholesterol and blood lipids Have your blood tested for lipids and cholesterol at 61 years of age, then have this test every 5 years. Have your cholesterol levels checked more  often if:  Your lipid or cholesterol levels are high.  You are older than 61 years of age.  You are at high risk for heart disease. What should I know about cancer screening? Depending on your health history and family history, you may need to have cancer screening at various ages. This may include screening for:  Breast cancer.  Cervical cancer.  Colorectal cancer.  Skin cancer.  Lung cancer. What should I know about heart disease, diabetes, and high blood pressure? Blood pressure and heart disease  High blood pressure causes heart disease and increases the risk of stroke. This is more likely to develop in people who have high blood pressure readings, are of African descent, or are overweight.  Have your blood pressure checked: ? Every 3-5 years if you are 18-39 years of age. ? Every year if you are 40 years old or older. Diabetes Have regular diabetes screenings. This checks your fasting blood sugar level. Have the screening done:  Once every three years after age 40 if you are at a normal weight and have a low risk for diabetes.  More often and at a younger age if you are overweight or have a high risk for diabetes. What should I know about preventing infection? Hepatitis B If you have a higher risk for hepatitis B, you should be screened for this virus. Talk with your health care provider to find out if you are at risk for hepatitis B infection. Hepatitis C Testing is recommended for:  Everyone born from 1945 through 1965.  Anyone with known risk factors for hepatitis   C. Sexually transmitted infections (STIs)  Get screened for STIs, including gonorrhea and chlamydia, if: ? You are sexually active and are younger than 61 years of age. ? You are older than 61 years of age and your health care provider tells you that you are at risk for this type of infection. ? Your sexual activity has changed since you were last screened, and you are at increased risk for chlamydia  or gonorrhea. Ask your health care provider if you are at risk.  Ask your health care provider about whether you are at high risk for HIV. Your health care provider may recommend a prescription medicine to help prevent HIV infection. If you choose to take medicine to prevent HIV, you should first get tested for HIV. You should then be tested every 3 months for as long as you are taking the medicine. Pregnancy  If you are about to stop having your period (premenopausal) and you may become pregnant, seek counseling before you get pregnant.  Take 400 to 800 micrograms (mcg) of folic acid every day if you become pregnant.  Ask for birth control (contraception) if you want to prevent pregnancy. Osteoporosis and menopause Osteoporosis is a disease in which the bones lose minerals and strength with aging. This can result in bone fractures. If you are 65 years old or older, or if you are at risk for osteoporosis and fractures, ask your health care provider if you should:  Be screened for bone loss.  Take a calcium or vitamin D supplement to lower your risk of fractures.  Be given hormone replacement therapy (HRT) to treat symptoms of menopause. Follow these instructions at home: Lifestyle  Do not use any products that contain nicotine or tobacco, such as cigarettes, e-cigarettes, and chewing tobacco. If you need help quitting, ask your health care provider.  Do not use street drugs.  Do not share needles.  Ask your health care provider for help if you need support or information about quitting drugs. Alcohol use  Do not drink alcohol if: ? Your health care provider tells you not to drink. ? You are pregnant, may be pregnant, or are planning to become pregnant.  If you drink alcohol: ? Limit how much you use to 0-1 drink a day. ? Limit intake if you are breastfeeding.  Be aware of how much alcohol is in your drink. In the [place] one drink equals one 12 oz bottle of beer (355 mL), one 5 oz  glass of wine (148 mL), or one 1 oz glass of hard liquor (44 mL). General instructions  Schedule regular health, dental, and eye exams.  Stay current with your vaccines.  Tell your health care provider if: ? You often feel depressed. ? You have ever been abused or do not feel safe at home. Summary  Adopting a healthy lifestyle and getting preventive care are important in promoting health and wellness.  Follow your health care provider's instructions about healthy diet, exercising, and getting tested or screened for diseases.  Follow your health care provider's instructions on monitoring your cholesterol and blood pressure. This information is not intended to replace advice given to you by your health care provider. Make sure you discuss any questions you have with your health care provider. Document Revised: 09/13/2018 Document Reviewed: 09/13/2018 Elsevier Patient Education  2020 Elsevier Inc.  

## 2020-05-20 ENCOUNTER — Other Ambulatory Visit: Payer: Self-pay

## 2020-05-20 ENCOUNTER — Encounter: Payer: Self-pay | Admitting: Internal Medicine

## 2020-05-20 ENCOUNTER — Ambulatory Visit (INDEPENDENT_AMBULATORY_CARE_PROVIDER_SITE_OTHER): Payer: 59 | Admitting: Internal Medicine

## 2020-05-20 VITALS — BP 128/70 | HR 78 | Temp 98.0°F | Ht 64.0 in | Wt 130.0 lb

## 2020-05-20 DIAGNOSIS — E559 Vitamin D deficiency, unspecified: Secondary | ICD-10-CM

## 2020-05-20 DIAGNOSIS — R945 Abnormal results of liver function studies: Secondary | ICD-10-CM | POA: Diagnosis not present

## 2020-05-20 DIAGNOSIS — M810 Age-related osteoporosis without current pathological fracture: Secondary | ICD-10-CM | POA: Diagnosis not present

## 2020-05-20 DIAGNOSIS — Z0000 Encounter for general adult medical examination without abnormal findings: Secondary | ICD-10-CM | POA: Diagnosis not present

## 2020-05-20 DIAGNOSIS — Z1211 Encounter for screening for malignant neoplasm of colon: Secondary | ICD-10-CM

## 2020-05-20 DIAGNOSIS — I10 Essential (primary) hypertension: Secondary | ICD-10-CM | POA: Diagnosis not present

## 2020-05-20 DIAGNOSIS — R7303 Prediabetes: Secondary | ICD-10-CM | POA: Diagnosis not present

## 2020-05-20 DIAGNOSIS — R7989 Other specified abnormal findings of blood chemistry: Secondary | ICD-10-CM

## 2020-05-20 DIAGNOSIS — E785 Hyperlipidemia, unspecified: Secondary | ICD-10-CM

## 2020-05-20 MED ORDER — METOPROLOL SUCCINATE ER 25 MG PO TB24: 25.0000 mg | ORAL_TABLET | Freq: Every day | ORAL | 3 refills | Status: DC

## 2020-05-20 MED ORDER — SHINGRIX 50 MCG/0.5ML IM SUSR: 0.5000 mL | Freq: Once | INTRAMUSCULAR | 1 refills | Status: AC

## 2020-05-20 MED ORDER — ATORVASTATIN CALCIUM 10 MG PO TABS: 10.0000 mg | ORAL_TABLET | Freq: Every day | ORAL | 3 refills | Status: DC

## 2020-05-20 MED ORDER — HYDROCHLOROTHIAZIDE 12.5 MG PO CAPS: ORAL_CAPSULE | ORAL | 3 refills | Status: DC

## 2020-05-20 MED FILL — HYDROCHLOROTHIAZIDE 12.5 MG: 12.5 | 90 days supply | Qty: 90 | Fill #0

## 2020-05-20 NOTE — Assessment & Plan Note (Signed)
Chronic Mild Likely fatty liver Has lost some weight cmp today

## 2020-05-20 NOTE — Assessment & Plan Note (Signed)
Chronic Check lipid panel  Continue daily statin Regular exercise and healthy diet encouraged  

## 2020-05-20 NOTE — Assessment & Plan Note (Signed)
Chronic Does not want to take any meds so dexa deferred Walking regularly Taking cal/vitamin d Ck vitamin d level

## 2020-05-20 NOTE — Assessment & Plan Note (Signed)
Chronic ?Check vitamin d level ?

## 2020-05-20 NOTE — Assessment & Plan Note (Signed)
Chronic BP well controlled Current regimen effective and well tolerated Continue current medications at current doses cmp  

## 2020-05-20 NOTE — Assessment & Plan Note (Signed)
Chronic Check a1c Low sugar / carb diet Stressed regular exercise  

## 2020-05-21 LAB — CBC WITH DIFFERENTIAL/PLATELET
Absolute Monocytes: 679 cells/uL (ref 200–950)
Basophils Absolute: 119 cells/uL (ref 0–200)
Basophils Relative: 1.5 %
Eosinophils Absolute: 221 cells/uL (ref 15–500)
Eosinophils Relative: 2.8 %
HCT: 43.2 % (ref 35.0–45.0)
Hemoglobin: 14.4 g/dL (ref 11.7–15.5)
Lymphs Abs: 2125 cells/uL (ref 850–3900)
MCH: 30.4 pg (ref 27.0–33.0)
MCHC: 33.3 g/dL (ref 32.0–36.0)
MCV: 91.1 fL (ref 80.0–100.0)
MPV: 10.3 fL (ref 7.5–12.5)
Monocytes Relative: 8.6 %
Neutro Abs: 4756 cells/uL (ref 1500–7800)
Neutrophils Relative %: 60.2 %
Platelets: 284 10*3/uL (ref 140–400)
RBC: 4.74 10*6/uL (ref 3.80–5.10)
RDW: 12.1 % (ref 11.0–15.0)
Total Lymphocyte: 26.9 %
WBC: 7.9 10*3/uL (ref 3.8–10.8)

## 2020-05-21 LAB — COMPREHENSIVE METABOLIC PANEL
AG Ratio: 1.6 (calc) (ref 1.0–2.5)
ALT: 106 U/L — ABNORMAL HIGH (ref 6–29)
AST: 56 U/L — ABNORMAL HIGH (ref 10–35)
Albumin: 4.4 g/dL (ref 3.6–5.1)
Alkaline phosphatase (APISO): 136 U/L (ref 37–153)
BUN: 13 mg/dL (ref 7–25)
CO2: 30 mmol/L (ref 20–32)
Calcium: 9.6 mg/dL (ref 8.6–10.4)
Chloride: 100 mmol/L (ref 98–110)
Creat: 0.62 mg/dL (ref 0.50–0.99)
Globulin: 2.7 g/dL (calc) (ref 1.9–3.7)
Glucose, Bld: 97 mg/dL (ref 65–99)
Potassium: 4.6 mmol/L (ref 3.5–5.3)
Sodium: 136 mmol/L (ref 135–146)
Total Bilirubin: 0.5 mg/dL (ref 0.2–1.2)
Total Protein: 7.1 g/dL (ref 6.1–8.1)

## 2020-05-21 LAB — VITAMIN D 25 HYDROXY (VIT D DEFICIENCY, FRACTURES): Vit D, 25-Hydroxy: 27 ng/mL — ABNORMAL LOW (ref 30–100)

## 2020-05-21 LAB — LIPID PANEL
Cholesterol: 179 mg/dL (ref <200)
HDL: 51 mg/dL (ref >=50)
LDL Cholesterol (Calc): 100 mg/dL (calc) — ABNORMAL HIGH
Non-HDL Cholesterol (Calc): 128 mg/dL (calc) (ref <130)
Total CHOL/HDL Ratio: 3.5 (calc) (ref <5.0)
Triglycerides: 180 mg/dL — ABNORMAL HIGH (ref <150)

## 2020-05-21 LAB — HEMOGLOBIN A1C
Hgb A1c MFr Bld: 5.7 % of total Hgb — ABNORMAL HIGH (ref <5.7)
Mean Plasma Glucose: 117 (calc)
eAG (mmol/L): 6.5 (calc)

## 2020-05-21 LAB — TSH: TSH: 0.82 mIU/L (ref 0.40–4.50)

## 2020-06-10 DIAGNOSIS — Z1211 Encounter for screening for malignant neoplasm of colon: Secondary | ICD-10-CM | POA: Diagnosis not present

## 2020-06-10 LAB — COLOGUARD: Cologuard: NEGATIVE

## 2020-06-15 LAB — EXTERNAL GENERIC LAB PROCEDURE: COLOGUARD: NEGATIVE

## 2020-06-15 LAB — COLOGUARD: COLOGUARD: NEGATIVE

## 2020-06-18 ENCOUNTER — Encounter: Payer: Self-pay | Admitting: Internal Medicine

## 2020-06-19 ENCOUNTER — Encounter: Payer: Self-pay | Admitting: Internal Medicine

## 2020-06-30 MED FILL — METOPROLOL SUCCINATE ER 25: 25 | 90 days supply | Qty: 90 | Fill #1

## 2020-06-30 MED FILL — ATORVASTATIN CALCIUM 10 MG: 10 | 90 days supply | Qty: 90 | Fill #1

## 2020-06-30 MED FILL — HYDROCHLOROTHIAZIDE 12.5 MG: 12.5 | 90 days supply | Qty: 90 | Fill #0

## 2020-10-05 ENCOUNTER — Telehealth: Payer: 59 | Admitting: Family

## 2020-10-05 DIAGNOSIS — R059 Cough, unspecified: Secondary | ICD-10-CM

## 2020-10-05 MED ORDER — PREDNISONE 10 MG (21) PO TBPK: ORAL_TABLET | ORAL | 0 refills | Status: DC

## 2020-10-05 MED ORDER — BENZONATATE 100 MG PO CAPS: 100.0000 mg | ORAL_CAPSULE | Freq: Three times a day (TID) | ORAL | 0 refills | Status: DC | PRN

## 2020-10-05 MED ORDER — FLUTICASONE PROPIONATE 50 MCG/ACT NA SUSP: 2.0000 | Freq: Every day | NASAL | 6 refills | Status: DC

## 2020-10-05 NOTE — Progress Notes (Signed)

## 2020-10-13 ENCOUNTER — Other Ambulatory Visit: Payer: Self-pay | Admitting: Internal Medicine

## 2020-10-13 MED FILL — METOPROLOL SUCCINATE ER 25: 25 | 90 days supply | Qty: 90 | Fill #0

## 2020-10-13 MED FILL — ATORVASTATIN CALCIUM 10 MG: 10 | 90 days supply | Qty: 90 | Fill #0

## 2020-10-13 MED FILL — HYDROCHLOROTHIAZIDE 12.5 MG: 12.5 | 90 days supply | Qty: 90 | Fill #1

## 2020-12-31 MED FILL — HYDROCHLOROTHIAZIDE 12.5 MG: 12.5 | 90 days supply | Qty: 90 | Fill #2

## 2020-12-31 MED FILL — ATORVASTATIN CALCIUM 10 MG: 10 | 90 days supply | Qty: 90 | Fill #1

## 2020-12-31 MED FILL — METOPROLOL SUCCINATE ER 25: 25 | 90 days supply | Qty: 90 | Fill #1

## 2021-02-25 ENCOUNTER — Ambulatory Visit: Payer: Self-pay | Attending: Internal Medicine

## 2021-02-25 DIAGNOSIS — Z23 Encounter for immunization: Secondary | ICD-10-CM

## 2021-02-25 NOTE — Progress Notes (Signed)
   Covid-[facility] Vaccination [facility]  Name:  [name]    MRN: [id number] DOB: 1960-11-01  02/25/2021  [name] was observed post Covid-19 immunization for 15 minutes without incident. She was provided with Vaccine Information Sheet and instruction to access the V-Safe system.   [name] was instructed to call 911 with any severe reactions post vaccine: Marland Kitchen Difficulty breathing  . Swelling of face and throat  . A fast heartbeat  . A bad rash all over body  . Dizziness and weakness   Immunizations Administered    Name Date Dose VIS Date Route   PFIZER Comrnaty(Gray TOP) Covid-19 Vaccine 02/25/2021  9:11 AM 0.3 mL 09/11/2020 Intramuscular   Manufacturer: [employer], [employer]   Lot: [id number]   NDC: [phone]

## 2021-02-26 ENCOUNTER — Other Ambulatory Visit (HOSPITAL_COMMUNITY): Payer: Self-pay

## 2021-02-26 MED ORDER — COVID-19 MRNA VAC-TRIS(PFIZER) 30 MCG/0.3ML IM SUSP
INTRAMUSCULAR | 0 refills | Status: DC
  Filled 2021-02-26: qty 0.3, 17d supply, fill #0

## 2021-02-27 ENCOUNTER — Other Ambulatory Visit (HOSPITAL_COMMUNITY): Payer: Self-pay

## 2021-03-10 ENCOUNTER — Other Ambulatory Visit (HOSPITAL_COMMUNITY): Payer: Self-pay

## 2021-03-10 ENCOUNTER — Other Ambulatory Visit: Payer: Self-pay | Admitting: Internal Medicine

## 2021-03-10 MED ORDER — METOPROLOL SUCCINATE ER 25 MG PO TB24
25.0000 mg | ORAL_TABLET | Freq: Every day | ORAL | 1 refills | Status: DC
  Filled 2021-03-10: qty 90, 90d supply, fill #0

## 2021-03-10 MED ORDER — ATORVASTATIN CALCIUM 10 MG PO TABS
10.0000 mg | ORAL_TABLET | Freq: Every day | ORAL | 1 refills | Status: DC
  Filled 2021-03-10: qty 90, 90d supply, fill #0

## 2021-03-10 MED FILL — Hydrochlorothiazide Cap 12.5 MG: ORAL | 90 days supply | Qty: 90 | Fill #0 | Status: AC

## 2021-03-11 ENCOUNTER — Other Ambulatory Visit (HOSPITAL_COMMUNITY): Payer: Self-pay

## 2021-05-25 NOTE — Progress Notes (Signed)
Subjective:    Patient ID: [name], female    DOB: 1961-02-24, 60 y.o.   MRN: [id number]   This visit occurred during the SARS-CoV-2 public health emergency.  Safety protocols were in place, including screening questions prior to the visit, additional usage of staff PPE, and extensive cleaning of exam room while observing appropriate contact time as indicated for disinfecting solutions.    HPI She is here for follow up of her chronic medical conditions.    She denies changes in her health since she was here last year.  She has no concerns, except she has noted that her lower back seems to be helping more.  She denies any pain associated with that.  She does have a history of back surgery in the past.    Medications and allergies reviewed with patient and updated if appropriate.  Patient Active Problem List   Diagnosis Date Noted   Palpitations 03/06/2018   Prediabetes 06/09/2017   Vitamin D deficiency 06/08/2017   Osteoporosis 03/09/2016   Abnormal LFTs (liver function tests) 06/13/2015   Snoring 08/09/2012   Dyslipidemia 05/07/2011   Migraine headache 01/10/2009   Essential hypertension 01/10/2009   DIVERTICULITIS, HX OF 01/10/2009    Current Outpatient Medications on File Prior to Visit  Medication Sig Dispense Refill   aspirin-acetaminophen-caffeine (EXCEDRIN MIGRAINE) 250-250-65 MG per tablet Take 2 tablets by mouth every 6 (six) hours as needed.      cholecalciferol (VITAMIN D3) 25 MCG (1000 UT) tablet Take 1,000 Units by mouth daily. Every other day     No current facility-administered medications on file prior to visit.    Past Medical History:  Diagnosis Date   ANEMIA-NOS    Chronic migraine    improved w/ "LBS" herbal   DIVERTICULITIS, HX OF    HYPERTENSION    MENOPAUSAL SYNDROME    OSTEOPENIA    SCIATICA, LEFT    resolved s/p l5-s1 fusion 09/2010   Shingles outbreak 1997   R thoracic outbreak    Past Surgical History:  Procedure Laterality  Date   ABDOMINAL HYSTERECTOMY  2006   parital   APPENDECTOMY  2008   bladder Tact  2006   LUMBAR FUSION  09/2010   Right L5-S1 laminectomy  02/2009    Social History   Socioeconomic History   Marital status: Single    Spouse name: Not on file   Number of children: Not on file   Years of education: Not on file   Highest education level: Not on file  Occupational History   Not on file  Tobacco Use   Smoking status: Former    Packs/day: 1.00    Years: 15.00    Pack years: 15.00    Types: Cigarettes    Quit date: 10/04/1992    Years since quitting: 28.6   Smokeless tobacco: Never  Substance and Sexual Activity   Alcohol use: Yes    Comment: 1 glass of wine twice a year   Drug use: No   Sexual activity: Not on file  Other Topics Concern   Not on file  Social History Narrative   Lives with partner   Works Administrator, 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   No kids   Social Determinants of Corporate investment banker Strain: Not on file  Food Insecurity: Not on file  Transportation Needs: Not on file  Physical Activity: Not on file  Stress: Not on file  Social Connections: Not on file  Family History  Problem Relation Age of Onset   Hypertension Mother    Hypothyroidism Mother    Coronary artery disease Father    Hypertension Father    Parkinson's disease Father    Hypothyroidism Maternal Grandmother    Coronary artery disease Maternal Grandmother    Alzheimer's disease Maternal Grandmother 31   Arthritis Paternal Grandmother    Diabetes Paternal Grandmother    Coronary artery disease Paternal Grandmother    Kidney cancer Brother        RCC s/p nephrectomy   Heart block Brother        PPM in 20's    Review of Systems  Constitutional:  Negative for chills and fever.  Eyes:  Negative for visual disturbance.  Respiratory:  Negative for cough, shortness of breath and wheezing.   Cardiovascular:  Negative for chest pain, palpitations and leg swelling.   Gastrointestinal:  Negative for abdominal pain, blood in stool, constipation, diarrhea and nausea.  Genitourinary:  Negative for dysuria and hematuria.  Musculoskeletal:  Negative for arthralgias and back pain.       Back is popping  Skin:  Negative for rash.  Neurological:  Positive for headaches (migraines - controlled). Negative for dizziness and light-headedness.  Psychiatric/Behavioral:  Negative for dysphoric mood. The patient is not nervous/anxious.       Objective:   Vitals:   05/26/21 1259  BP: 132/76  Pulse: 68  Temp: 98.2 F (36.8 C)  SpO2: 97%   Filed Weights   05/26/21 1259  Weight: 131 lb (59.4 kg)   Body mass index is 22.49 kg/m.  BP Readings from Last 3 Encounters:  05/26/21 132/76  05/20/20 128/70  10/17/19 136/82    Wt Readings from Last 3 Encounters:  05/26/21 131 lb (59.4 kg)  05/20/20 130 lb (59 kg)  10/17/19 142 lb 12.8 oz (64.8 kg)    Depression screen [facility] 2/9 05/26/2021 10/17/2019 09/05/2018  Decreased Interest 0 0 0  Down, Depressed, Hopeless 0 0 0  PHQ - 2 Score 0 0 0  Altered sleeping 2 - -  Tired, decreased energy 0 - -  Change in appetite 0 - -  Feeling bad or failure about yourself  0 - -  Trouble concentrating 0 - -  Moving slowly or fidgety/restless 0 - -  Suicidal thoughts 0 - -  PHQ-9 Score 2 - -  Difficult doing work/chores Not difficult at all - -    GAD 7 : Generalized Anxiety Score 05/26/2021  Nervous, Anxious, on Edge 0  Control/stop worrying 0  Worry too much - different things 0  Trouble relaxing 0  Restless 0  Easily annoyed or irritable 0  Afraid - awful might happen 0  Total GAD 7 Score 0  Anxiety Difficulty Not difficult at all       Physical Exam Constitutional: She appears well-developed and well-nourished. No distress.  HENT:  Head: Normocephalic and atraumatic.  Right Ear: External ear normal. Normal ear canal and TM Left Ear: External ear normal.  Normal ear canal and TM Mouth/Throat: Oropharynx  is clear and moist.  Eyes: Conjunctivae and EOM are normal.  Neck: Neck supple. No tracheal deviation present. No thyromegaly present.  No carotid bruit  Cardiovascular: Normal rate, regular rhythm and normal heart sounds.   No murmur heard.  No edema. Pulmonary/Chest: Effort normal and breath sounds normal. No respiratory distress. She has no wheezes. She has no rales.  Breast: deferred   Abdominal: Soft. She exhibits no distension.  There is no tenderness.  Lymphadenopathy: She has no cervical adenopathy.  Skin: Skin is warm and dry. She is not diaphoretic.  Psychiatric: She has a normal mood and affect. Her behavior is normal.     Lab Results  Component Value Date   WBC 7.9 05/20/2020   HGB 14.4 05/20/2020   HCT 43.2 05/20/2020   PLT 284 05/20/2020   GLUCOSE 97 05/20/2020   CHOL 179 05/20/2020   TRIG 180 (H) 05/20/2020   HDL 51 05/20/2020   LDLDIRECT 108.0 10/17/2019   LDLCALC 100 (H) 05/20/2020   ALT 106 (H) 05/20/2020   AST 56 (H) 05/20/2020   NA 136 05/20/2020   K 4.6 05/20/2020   CL 100 05/20/2020   CREATININE 0.62 05/20/2020   BUN 13 05/20/2020   CO2 30 05/20/2020   TSH 0.82 05/20/2020   INR 0.95 09/11/2010   HGBA1C 5.7 (H) 05/20/2020         Assessment & Plan:   She is exercising regularly.     Reviewed recommended immunizations.   Health Maintenance  Topic Date Due   Zoster Vaccines- Shingrix (1 of 2) Never done   MAMMOGRAM  01/05/2018   COVID-19 Vaccine (2 - Pfizer series) 03/18/2021   INFLUENZA VACCINE  05/04/2021   DEXA SCAN  05/26/2029 (Originally 01/05/2018)   TETANUS/TDAP  07/13/2022   Fecal DNA (Cologuard)  06/11/2023   Hepatitis C Screening  Completed   HIV Screening  Completed   Pneumococcal Vaccine 55-55 Years old  Aged Out   HPV VACCINES  Aged Out     Screened for depression using the PHQ 9 scale.  No evidence of depression.   Screened for anxiety using GAD7 Scale.  No evidence of anxiety.    See Problem List for Assessment and  Plan of chronic medical problems.

## 2021-05-26 ENCOUNTER — Other Ambulatory Visit: Payer: Self-pay

## 2021-05-26 ENCOUNTER — Ambulatory Visit (INDEPENDENT_AMBULATORY_CARE_PROVIDER_SITE_OTHER): Payer: PRIVATE HEALTH INSURANCE | Admitting: Internal Medicine

## 2021-05-26 ENCOUNTER — Encounter: Payer: Self-pay | Admitting: Internal Medicine

## 2021-05-26 VITALS — BP 132/76 | HR 68 | Temp 98.2°F | Ht 64.0 in | Wt 131.0 lb

## 2021-05-26 DIAGNOSIS — M810 Age-related osteoporosis without current pathological fracture: Secondary | ICD-10-CM

## 2021-05-26 DIAGNOSIS — R7303 Prediabetes: Secondary | ICD-10-CM

## 2021-05-26 DIAGNOSIS — Z0000 Encounter for general adult medical examination without abnormal findings: Secondary | ICD-10-CM

## 2021-05-26 DIAGNOSIS — Z1331 Encounter for screening for depression: Secondary | ICD-10-CM | POA: Diagnosis not present

## 2021-05-26 DIAGNOSIS — E785 Hyperlipidemia, unspecified: Secondary | ICD-10-CM | POA: Diagnosis not present

## 2021-05-26 DIAGNOSIS — G43109 Migraine with aura, not intractable, without status migrainosus: Secondary | ICD-10-CM

## 2021-05-26 DIAGNOSIS — I10 Essential (primary) hypertension: Secondary | ICD-10-CM | POA: Diagnosis not present

## 2021-05-26 MED ORDER — METOPROLOL SUCCINATE ER 25 MG PO TB24: 25.0000 mg | ORAL_TABLET | Freq: Every day | ORAL | 3 refills | Status: DC

## 2021-05-26 MED ORDER — ATORVASTATIN CALCIUM 10 MG PO TABS: 10.0000 mg | ORAL_TABLET | Freq: Every day | ORAL | 3 refills | Status: DC

## 2021-05-26 MED ORDER — HYDROCHLOROTHIAZIDE 12.5 MG PO CAPS: ORAL_CAPSULE | Freq: Every day | ORAL | 3 refills | Status: DC

## 2021-05-26 NOTE — Patient Instructions (Addendum)
  Blood work was ordered.     Medications changes include :   none  Your prescription(s) have been submitted to your pharmacy. Please take as directed and contact our office if you believe you are having problem(s) with the medication(s).      Please followup in 1 year  

## 2021-05-26 NOTE — Assessment & Plan Note (Signed)
Chronic Continue atorvastatin 10 mg daily Check lipid panel, CMP, TSH

## 2021-05-26 NOTE — Assessment & Plan Note (Signed)
Chronic BP well controlled Continue metoprolol XL 25 mg daily, hydrochlorothiazide 12.5 mg daily cmp

## 2021-05-26 NOTE — Assessment & Plan Note (Signed)
Chronic Intermittent Overall controlled Continue Excedrin Migraine as needed

## 2021-05-26 NOTE — Assessment & Plan Note (Signed)
Chronic Deferred DEXA-she knows she does not want to take any medication and therefore sees no point in getting a DEXA Continue regular exercise Continue vitamin D supplementation-we will check vitamin D level

## 2021-05-26 NOTE — Assessment & Plan Note (Signed)
Chronic Check a1c Low sugar / carb diet Stressed regular exercise  

## 2021-06-10 ENCOUNTER — Other Ambulatory Visit: Payer: Self-pay

## 2021-06-10 ENCOUNTER — Other Ambulatory Visit (INDEPENDENT_AMBULATORY_CARE_PROVIDER_SITE_OTHER): Payer: PRIVATE HEALTH INSURANCE

## 2021-06-10 DIAGNOSIS — M810 Age-related osteoporosis without current pathological fracture: Secondary | ICD-10-CM | POA: Diagnosis not present

## 2021-06-10 DIAGNOSIS — R7303 Prediabetes: Secondary | ICD-10-CM | POA: Diagnosis not present

## 2021-06-10 DIAGNOSIS — E785 Hyperlipidemia, unspecified: Secondary | ICD-10-CM | POA: Diagnosis not present

## 2021-06-10 DIAGNOSIS — I10 Essential (primary) hypertension: Secondary | ICD-10-CM

## 2021-06-10 LAB — LIPID PANEL
Cholesterol: 153 mg/dL (ref 0–200)
HDL: 52.7 mg/dL (ref >39.00)
LDL Cholesterol: 83 mg/dL (ref 0–99)
NonHDL: 99.99
Total CHOL/HDL Ratio: 3
Triglycerides: 87 mg/dL (ref 0.0–149.0)
VLDL: 17.4 mg/dL (ref 0.0–40.0)

## 2021-06-10 LAB — HEMOGLOBIN A1C: Hgb A1c MFr Bld: 5.9 % (ref 4.6–6.5)

## 2021-06-10 LAB — CBC WITH DIFFERENTIAL/PLATELET
Basophils Absolute: 0.1 10*3/uL (ref 0.0–0.1)
Basophils Relative: 1 % (ref 0.0–3.0)
Eosinophils Absolute: 0.2 10*3/uL (ref 0.0–0.7)
Eosinophils Relative: 2.8 % (ref 0.0–5.0)
HCT: 40.9 % (ref 36.0–46.0)
Hemoglobin: 13.8 g/dL (ref 12.0–15.0)
Lymphocytes Relative: 35.1 % (ref 12.0–46.0)
Lymphs Abs: 2.1 10*3/uL (ref 0.7–4.0)
MCHC: 33.8 g/dL (ref 30.0–36.0)
MCV: 90.3 fl (ref 78.0–100.0)
Monocytes Absolute: 0.5 10*3/uL (ref 0.1–1.0)
Monocytes Relative: 8.9 % (ref 3.0–12.0)
Neutro Abs: 3.2 10*3/uL (ref 1.4–7.7)
Neutrophils Relative %: 52.2 % (ref 43.0–77.0)
Platelets: 256 10*3/uL (ref 150.0–400.0)
RBC: 4.53 Mil/uL (ref 3.87–5.11)
RDW: 13.3 % (ref 11.5–15.5)
WBC: 6.1 10*3/uL (ref 4.0–10.5)

## 2021-06-10 LAB — COMPREHENSIVE METABOLIC PANEL
ALT: 54 U/L — ABNORMAL HIGH (ref 0–35)
AST: 45 U/L — ABNORMAL HIGH (ref 0–37)
Albumin: 4.1 g/dL (ref 3.5–5.2)
Alkaline Phosphatase: 118 U/L — ABNORMAL HIGH (ref 39–117)
BUN: 12 mg/dL (ref 6–23)
CO2: 25 mEq/L (ref 19–32)
Calcium: 9.5 mg/dL (ref 8.4–10.5)
Chloride: 103 mEq/L (ref 96–112)
Creatinine, Ser: 0.61 mg/dL (ref 0.40–1.20)
GFR: 95.77 mL/min (ref >60.00)
Glucose, Bld: 130 mg/dL — ABNORMAL HIGH (ref 70–99)
Potassium: 4.3 mEq/L (ref 3.5–5.1)
Sodium: 137 mEq/L (ref 135–145)
Total Bilirubin: 0.4 mg/dL (ref 0.2–1.2)
Total Protein: 7.1 g/dL (ref 6.0–8.3)

## 2021-06-10 LAB — TSH: TSH: 1.21 u[IU]/mL (ref 0.35–5.50)

## 2021-06-10 LAB — VITAMIN D 25 HYDROXY (VIT D DEFICIENCY, FRACTURES): VITD: 40.9 ng/mL (ref 30.00–100.00)

## 2021-07-10 ENCOUNTER — Encounter: Payer: Self-pay | Admitting: Internal Medicine

## 2022-02-04 ENCOUNTER — Encounter: Payer: Self-pay | Admitting: Internal Medicine

## 2022-02-05 ENCOUNTER — Other Ambulatory Visit (HOSPITAL_COMMUNITY): Payer: Self-pay

## 2022-02-05 MED ORDER — ECONAZOLE NITRATE 1 % EX CREA
TOPICAL_CREAM | Freq: Every day | CUTANEOUS | 0 refills | Status: AC
  Filled 2022-02-05: qty 85, 30d supply, fill #0

## 2022-02-08 ENCOUNTER — Other Ambulatory Visit (HOSPITAL_COMMUNITY): Payer: Self-pay

## 2022-05-01 ENCOUNTER — Other Ambulatory Visit: Payer: Self-pay | Admitting: Internal Medicine

## 2022-05-26 ENCOUNTER — Encounter: Payer: PRIVATE HEALTH INSURANCE | Admitting: Internal Medicine

## 2022-06-22 ENCOUNTER — Encounter: Payer: Self-pay | Admitting: Internal Medicine

## 2022-06-22 NOTE — Progress Notes (Unsigned)
Subjective:    Patient ID: [name], female    DOB: 30-Apr-1961, 61 y.o.   MRN: [id number]      HPI [name] is here for a Physical exam.   Father passed away a couple of months ago - had cva, mom is still alive - has some dementia.   There is increased stress.  Had covid 2.5 weeks ago.  It took all her energy away - still tired - slowly getting better.  Residual cough.    Medications and allergies reviewed with patient and updated if appropriate.  Current Outpatient Medications on File Prior to Visit  Medication Sig Dispense Refill   aspirin-acetaminophen-caffeine (EXCEDRIN MIGRAINE) 250-250-65 MG per tablet Take 2 tablets by mouth every 6 (six) hours as needed.      atorvastatin (LIPITOR) 10 MG tablet TAKE 1 TABLET(10 MG) BY MOUTH DAILY 90 tablet 3   cholecalciferol (VITAMIN D3) 25 MCG (1000 UT) tablet Take 1,000 Units by mouth daily. Every other day     econazole nitrate 1 % cream Apply topically daily. 85 g 0   hydrochlorothiazide (MICROZIDE) 12.5 MG capsule TAKE 1 CAPSULE BY MOUTH EVERY DAY 90 capsule 3   metoprolol succinate (TOPROL-XL) 25 MG 24 hr tablet TAKE 1 TABLET(25 MG) BY MOUTH DAILY 90 tablet 3   No current facility-administered medications on file prior to visit.    Review of Systems  Constitutional:  Negative for fever.  Eyes:  Negative for visual disturbance.  Respiratory:  Positive for cough (reidual from covid). Negative for shortness of breath and wheezing.   Cardiovascular:  Negative for chest pain, palpitations and leg swelling.  Gastrointestinal:  Negative for abdominal pain, blood in stool, constipation, diarrhea and nausea.       No gerd  Genitourinary:  Negative for dysuria.  Musculoskeletal:  Negative for arthralgias and back pain.  Skin:  Negative for rash.  Neurological:  Positive for headaches (stress related). Negative for light-headedness.  Psychiatric/Behavioral:  Negative for dysphoric mood. The patient is nervous/anxious.         Objective:   Vitals:   06/23/22 1424  BP: 132/72  Pulse: 71  Temp: 98.3 F (36.8 C)  SpO2: 98%   Filed Weights   06/23/22 1424  Weight: 134 lb 3.2 oz (60.9 kg)   Body mass index is 23.04 kg/m.  BP Readings from Last 3 Encounters:  06/23/22 132/72  05/26/21 132/76  05/20/20 128/70    Wt Readings from Last 3 Encounters:  06/23/22 134 lb 3.2 oz (60.9 kg)  05/26/21 131 lb (59.4 kg)  05/20/20 130 lb (59 kg)       Physical Exam Constitutional: She appears well-developed and well-nourished. No distress.  HENT:  Head: Normocephalic and atraumatic.  Right Ear: External ear normal. Normal ear canal and TM Left Ear: External ear normal.  Normal ear canal and TM Mouth/Throat: Oropharynx is clear and moist.  Eyes: Conjunctivae normal.  Neck: Neck supple. No tracheal deviation present. No thyromegaly present.  No carotid bruit  Cardiovascular: Normal rate, regular rhythm and normal heart sounds.   No murmur heard.  No edema. Pulmonary/Chest: Effort normal and breath sounds normal. No respiratory distress. She has no wheezes. She has no rales.  Breast: deferred   Abdominal: Soft. She exhibits no distension. There is no tenderness.  Lymphadenopathy: She has no cervical adenopathy.  Skin: Skin is warm and dry. She is not diaphoretic.  Psychiatric: She has a normal mood and affect. Her behavior is normal.  Lab Results  Component Value Date   WBC 6.1 06/10/2021   HGB 13.8 06/10/2021   HCT 40.9 06/10/2021   PLT 256.0 06/10/2021   GLUCOSE 130 (H) 06/10/2021   CHOL 153 06/10/2021   TRIG 87.0 06/10/2021   HDL 52.70 06/10/2021   LDLDIRECT 108.0 10/17/2019   LDLCALC 83 06/10/2021   ALT 54 (H) 06/10/2021   AST 45 (H) 06/10/2021   NA 137 06/10/2021   K 4.3 06/10/2021   CL 103 06/10/2021   CREATININE 0.61 06/10/2021   BUN 12 06/10/2021   CO2 25 06/10/2021   TSH 1.21 06/10/2021   INR 0.95 09/11/2010   HGBA1C 5.9 06/10/2021         Assessment & Plan:    Physical exam: Screening blood work  ordered Exercise  gardening Weight  normal Substance abuse  none   Reviewed recommended immunizations.   Health Maintenance  Topic Date Due   COVID-19 Vaccine (2 - Pfizer series) 07/09/2022 (Originally 04/22/2021)   Zoster Vaccines- Shingrix (1 of 2) 09/22/2022 (Originally 12/15/2008)   INFLUENZA VACCINE  01/02/2023 (Originally 05/04/2022)   TETANUS/TDAP  07/13/2022   Fecal DNA (Cologuard)  06/11/2023   Hepatitis C Screening  Completed   HIV Screening  Completed   HPV VACCINES  Aged Out   MAMMOGRAM  Discontinued   DEXA SCAN  Discontinued   COLONOSCOPY (Pts 45-59yrs Insurance coverage will need to be confirmed)  Discontinued          See Problem List for Assessment and Plan of chronic medical problems.

## 2022-06-22 NOTE — Patient Instructions (Addendum)
Blood work was ordered.     Medications changes include :   none   Your prescription(s) have been sent to your pharmacy.     Return in about 1 year (around 06/24/2023) for follow up.     Health Maintenance, Female Adopting a healthy lifestyle and getting preventive care are important in promoting health and wellness. Ask your health care provider about: The right schedule for you to have regular tests and exams. Things you can do on your own to prevent diseases and keep yourself healthy. What should I know about diet, weight, and exercise? Eat a healthy diet  Eat a diet that includes plenty of vegetables, fruits, low-fat dairy products, and lean protein. Do not eat a lot of foods that are high in solid fats, added sugars, or sodium. Maintain a healthy weight Body mass index (BMI) is used to identify weight problems. It estimates body fat based on height and weight. Your health care provider can help determine your BMI and help you achieve or maintain a healthy weight. Get regular exercise Get regular exercise. This is one of the most important things you can do for your health. Most adults should: Exercise for at least 150 minutes each week. The exercise should increase your heart rate and make you sweat (moderate-intensity exercise). Do strengthening exercises at least twice a week. This is in addition to the moderate-intensity exercise. Spend less time sitting. Even light physical activity can be beneficial. Watch cholesterol and blood lipids Have your blood tested for lipids and cholesterol at 63 years of age, then have this test every 5 years. Have your cholesterol levels checked more often if: Your lipid or cholesterol levels are high. You are older than 63 years of age. You are at high risk for heart disease. What should I know about cancer screening? Depending on your health history and family history, you may need to have cancer screening at various ages. This may  include screening for: Breast cancer. Cervical cancer. Colorectal cancer. Skin cancer. Lung cancer. What should I know about heart disease, diabetes, and high blood pressure? Blood pressure and heart disease High blood pressure causes heart disease and increases the risk of stroke. This is more likely to develop in people who have high blood pressure readings or are overweight. Have your blood pressure checked: Every 3-5 years if you are 1-8 years of age. Every year if you are 63 years old or older. Diabetes Have regular diabetes screenings. This checks your fasting blood sugar level. Have the screening done: Once every three years after age 63 if you are at a normal weight and have a low risk for diabetes. More often and at a younger age if you are overweight or have a high risk for diabetes. What should I know about preventing infection? Hepatitis B If you have a higher risk for hepatitis B, you should be screened for this virus. Talk with your health care provider to find out if you are at risk for hepatitis B infection. Hepatitis C Testing is recommended for: Everyone born from [birth year] through 1967. Anyone with known risk factors for hepatitis C. Sexually transmitted infections (STIs) Get screened for STIs, including gonorrhea and chlamydia, if: You are sexually active and are younger than 63 years of age. You are older than 63 years of age and your health care provider tells you that you are at risk for this type of infection. Your sexual activity has changed since you were last screened, and  you are at increased risk for chlamydia or gonorrhea. Ask your health care provider if you are at risk. Ask your health care provider about whether you are at high risk for HIV. Your health care provider may recommend a prescription medicine to help prevent HIV infection. If you choose to take medicine to prevent HIV, you should first get tested for HIV. You should then be tested every 3 months  for as long as you are taking the medicine. Pregnancy If you are about to stop having your period (premenopausal) and you may become pregnant, seek counseling before you get pregnant. Take 400 to 800 micrograms (mcg) of folic acid every day if you become pregnant. Ask for birth control (contraception) if you want to prevent pregnancy. Osteoporosis and menopause Osteoporosis is a disease in which the bones lose minerals and strength with aging. This can result in bone fractures. If you are 66 years old or older, or if you are at risk for osteoporosis and fractures, ask your health care provider if you should: Be screened for bone loss. Take a calcium or vitamin D supplement to lower your risk of fractures. Be given hormone replacement therapy (HRT) to treat symptoms of menopause. Follow these instructions at home: Alcohol use Do not drink alcohol if: Your health care provider tells you not to drink. You are pregnant, may be pregnant, or are planning to become pregnant. If you drink alcohol: Limit how much you have to: 0-1 drink a day. Know how much alcohol is in your drink. In the [place] one drink equals one 12 oz bottle of beer (355 mL), one 5 oz glass of wine (148 mL), or one 1 oz glass of hard liquor (44 mL). Lifestyle Do not use any products that contain nicotine or tobacco. These products include cigarettes, chewing tobacco, and vaping devices, such as e-cigarettes. If you need help quitting, ask your health care provider. Do not use street drugs. Do not share needles. Ask your health care provider for help if you need support or information about quitting drugs. General instructions Schedule regular health, dental, and eye exams. Stay current with your vaccines. Tell your health care provider if: You often feel depressed. You have ever been abused or do not feel safe at home. Summary Adopting a healthy lifestyle and getting preventive care are important in promoting health and  wellness. Follow your health care provider's instructions about healthy diet, exercising, and getting tested or screened for diseases. Follow your health care provider's instructions on monitoring your cholesterol and blood pressure. This information is not intended to replace advice given to you by your health care provider. Make sure you discuss any questions you have with your health care provider. Document Revised: 02/09/2021 Document Reviewed: 02/09/2021 Elsevier Patient Education  [employer].

## 2022-06-23 ENCOUNTER — Encounter: Payer: Self-pay | Admitting: Internal Medicine

## 2022-06-23 ENCOUNTER — Ambulatory Visit (INDEPENDENT_AMBULATORY_CARE_PROVIDER_SITE_OTHER): Payer: No Typology Code available for payment source | Admitting: Internal Medicine

## 2022-06-23 VITALS — BP 132/72 | HR 71 | Temp 98.3°F | Ht 64.0 in | Wt 134.2 lb

## 2022-06-23 DIAGNOSIS — E785 Hyperlipidemia, unspecified: Secondary | ICD-10-CM | POA: Diagnosis not present

## 2022-06-23 DIAGNOSIS — I10 Essential (primary) hypertension: Secondary | ICD-10-CM | POA: Diagnosis not present

## 2022-06-23 DIAGNOSIS — M810 Age-related osteoporosis without current pathological fracture: Secondary | ICD-10-CM

## 2022-06-23 DIAGNOSIS — R7303 Prediabetes: Secondary | ICD-10-CM | POA: Diagnosis not present

## 2022-06-23 DIAGNOSIS — Z0000 Encounter for general adult medical examination without abnormal findings: Secondary | ICD-10-CM | POA: Diagnosis not present

## 2022-06-23 DIAGNOSIS — E559 Vitamin D deficiency, unspecified: Secondary | ICD-10-CM

## 2022-06-23 LAB — CBC WITH DIFFERENTIAL/PLATELET
Basophils Absolute: 0.1 10*3/uL (ref 0.0–0.1)
Basophils Relative: 0.8 % (ref 0.0–3.0)
Eosinophils Absolute: 0.1 10*3/uL (ref 0.0–0.7)
Eosinophils Relative: 1.5 % (ref 0.0–5.0)
HCT: 40.3 % (ref 36.0–46.0)
Hemoglobin: 13.5 g/dL (ref 12.0–15.0)
Lymphocytes Relative: 22.9 % (ref 12.0–46.0)
Lymphs Abs: 2.2 10*3/uL (ref 0.7–4.0)
MCHC: 33.6 g/dL (ref 30.0–36.0)
MCV: 87.7 fl (ref 78.0–100.0)
Monocytes Absolute: 0.6 10*3/uL (ref 0.1–1.0)
Monocytes Relative: 6.2 % (ref 3.0–12.0)
Neutro Abs: 6.5 10*3/uL (ref 1.4–7.7)
Neutrophils Relative %: 68.6 % (ref 43.0–77.0)
Platelets: 304 10*3/uL (ref 150.0–400.0)
RBC: 4.59 Mil/uL (ref 3.87–5.11)
RDW: 12.7 % (ref 11.5–15.5)
WBC: 9.5 10*3/uL (ref 4.0–10.5)

## 2022-06-23 LAB — COMPREHENSIVE METABOLIC PANEL
ALT: 51 U/L — ABNORMAL HIGH (ref 0–35)
AST: 43 U/L — ABNORMAL HIGH (ref 0–37)
Albumin: 4.1 g/dL (ref 3.5–5.2)
Alkaline Phosphatase: 124 U/L — ABNORMAL HIGH (ref 39–117)
BUN: 14 mg/dL (ref 6–23)
CO2: 25 mEq/L (ref 19–32)
Calcium: 9.6 mg/dL (ref 8.4–10.5)
Chloride: 102 mEq/L (ref 96–112)
Creatinine, Ser: 0.8 mg/dL (ref 0.40–1.20)
GFR: 78.36 mL/min (ref >60.00)
Glucose, Bld: 110 mg/dL — ABNORMAL HIGH (ref 70–99)
Potassium: 3.9 mEq/L (ref 3.5–5.1)
Sodium: 135 mEq/L (ref 135–145)
Total Bilirubin: 0.4 mg/dL (ref 0.2–1.2)
Total Protein: 7.6 g/dL (ref 6.0–8.3)

## 2022-06-23 LAB — LIPID PANEL
Cholesterol: 150 mg/dL (ref 0–200)
HDL: 49.9 mg/dL (ref >39.00)
LDL Cholesterol: 74 mg/dL (ref 0–99)
NonHDL: 100.01
Total CHOL/HDL Ratio: 3
Triglycerides: 130 mg/dL (ref 0.0–149.0)
VLDL: 26 mg/dL (ref 0.0–40.0)

## 2022-06-23 NOTE — Assessment & Plan Note (Signed)
Chronic Check lipid panel  Continue  Atorvastatin 10 mg daily Regular exercise and healthy diet encouraged

## 2022-06-23 NOTE — Assessment & Plan Note (Signed)
Chronic Deferred dexa, treatment

## 2022-06-23 NOTE — Assessment & Plan Note (Signed)
Chronic Check a1c Low sugar / carb diet Stressed regular exercise  

## 2022-06-23 NOTE — Assessment & Plan Note (Signed)
Chronic BP well controlled Continue hctz 12.5  Mg daily, metoprolol xl 25 mg daily cmp

## 2022-06-23 NOTE — Assessment & Plan Note (Signed)
Chronic Taking vitamin d daily 

## 2022-06-24 LAB — HEMOGLOBIN A1C: Hgb A1c MFr Bld: 6.1 % (ref 4.6–6.5)

## 2023-01-30 ENCOUNTER — Encounter: Payer: Self-pay | Admitting: Internal Medicine

## 2023-01-30 DIAGNOSIS — M6530 Trigger finger, unspecified finger: Secondary | ICD-10-CM

## 2023-02-15 ENCOUNTER — Encounter: Payer: Self-pay | Admitting: Internal Medicine

## 2023-03-31 ENCOUNTER — Other Ambulatory Visit: Payer: Self-pay | Admitting: Internal Medicine

## 2023-05-19 ENCOUNTER — Encounter (INDEPENDENT_AMBULATORY_CARE_PROVIDER_SITE_OTHER): Payer: Self-pay

## 2023-06-27 NOTE — Progress Notes (Unsigned)
Subjective:    Patient ID: [name], female    DOB: 1961/09/03, 61 y.o.   MRN: [id number]      HPI [name] is here for a Physical exam and her chronic medical problems.    Having a lot of arthritis in hands - right 3-4 fingers are worse.  Wondered about a nsaid rx - diclofenac    Medications and allergies reviewed with patient and updated if appropriate.  Current Outpatient Medications on File Prior to Visit  Medication Sig Dispense Refill   aspirin-acetaminophen-caffeine (EXCEDRIN MIGRAINE) 250-250-65 MG per tablet Take 2 tablets by mouth every 6 (six) hours as needed.      atorvastatin (LIPITOR) 10 MG tablet TAKE 1 TABLET(10 MG) BY MOUTH DAILY 90 tablet 1   cholecalciferol (VITAMIN D3) 25 MCG (1000 UT) tablet Take 1,000 Units by mouth daily. Every other day     econazole nitrate 1 % cream Apply topically daily. 85 g 0   hydrochlorothiazide (MICROZIDE) 12.5 MG capsule TAKE 1 CAPSULE BY MOUTH EVERY DAY 90 capsule 1   metoprolol succinate (TOPROL-XL) 25 MG 24 hr tablet TAKE 1 TABLET(25 MG) BY MOUTH DAILY 90 tablet 1   No current facility-administered medications on file prior to visit.    Review of Systems  Constitutional:  Negative for fever.  HENT:  Positive for nosebleeds (mild).   Eyes:  Negative for visual disturbance.  Respiratory:  Negative for cough, shortness of breath and wheezing.   Cardiovascular:  Negative for chest pain, palpitations and leg swelling.  Gastrointestinal:  Negative for abdominal pain, blood in stool, constipation and diarrhea.       No gerd  Genitourinary:  Negative for dysuria.  Musculoskeletal:  Positive for arthralgias (hands). Negative for back pain.  Skin:  Negative for rash.  Neurological:  Positive for headaches (occ). Negative for light-headedness.  Psychiatric/Behavioral:  Negative for dysphoric mood. The patient is not nervous/anxious.        Objective:   Vitals:   06/28/23 1258  BP: 138/80  Pulse: 80  Temp: 98 F (36.7  C)  SpO2: 97%   Filed Weights   06/28/23 1258  Weight: 134 lb 12.8 oz (61.1 kg)   Body mass index is 23.14 kg/m.  BP Readings from Last 3 Encounters:  06/28/23 138/80  06/23/22 132/72  05/26/21 132/76    Wt Readings from Last 3 Encounters:  06/28/23 134 lb 12.8 oz (61.1 kg)  06/23/22 134 lb 3.2 oz (60.9 kg)  05/26/21 131 lb (59.4 kg)       Physical Exam Constitutional: She appears well-developed and well-nourished. No distress.  HENT:  Head: Normocephalic and atraumatic.  Right Ear: External ear normal. Normal ear canal and TM Left Ear: External ear normal.  Normal ear canal and TM Mouth/Throat: Oropharynx is clear and moist.  Eyes: Conjunctivae normal.  Neck: Neck supple. No tracheal deviation present. No thyromegaly present.  No carotid bruit  Cardiovascular: Normal rate, regular rhythm and normal heart sounds.   No murmur heard.  No edema. Pulmonary/Chest: Effort normal and breath sounds normal. No respiratory distress. She has no wheezes. She has no rales.  Breast: deferred   Abdominal: Soft. She exhibits no distension. There is no tenderness.  Lymphadenopathy: She has no cervical adenopathy.  Skin: Skin is warm and dry. She is not diaphoretic.  Psychiatric: She has a normal mood and affect. Her behavior is normal.     Lab Results  Component Value Date   WBC 9.5 06/23/2022  HGB 13.5 06/23/2022   HCT 40.3 06/23/2022   PLT 304.0 06/23/2022   GLUCOSE 110 (H) 06/23/2022   CHOL 150 06/23/2022   TRIG 130.0 06/23/2022   HDL 49.90 06/23/2022   LDLDIRECT 108.0 10/17/2019   LDLCALC 74 06/23/2022   ALT 51 (H) 06/23/2022   AST 43 (H) 06/23/2022   NA 135 06/23/2022   K 3.9 06/23/2022   CL 102 06/23/2022   CREATININE 0.80 06/23/2022   BUN 14 06/23/2022   CO2 25 06/23/2022   TSH 1.21 06/10/2021   INR 0.95 09/11/2010   HGBA1C 6.1 06/23/2022         Assessment & Plan:   Physical exam: Screening blood work  ordered Exercise  active  - yard work , house  projects [employer]  normal  Substance abuse  none   Reviewed recommended immunizations.   Flu immunization administered today.     Health Maintenance  Topic Date Due   Zoster Vaccines- Shingrix (1 of 2) Never done   DTaP/Tdap/Td (3 - Td or Tdap) 07/13/2022   INFLUENZA VACCINE  05/05/2023   Fecal DNA (Cologuard)  06/11/2023   COVID-19 Vaccine (2 - 2023-24 season) 07/14/2023 (Originally 06/05/2023)   Hepatitis C Screening  Completed   HIV Screening  Completed   HPV VACCINES  Aged Out   MAMMOGRAM  Discontinued   DEXA SCAN  Discontinued   Colonoscopy  Discontinued          See Problem List for Assessment and Plan of chronic medical problems.

## 2023-06-27 NOTE — Patient Instructions (Addendum)
Flu immunization administered today.     Blood work was ordered.   The lab is on the first floor.    Medications changes include :   diclofenac      Return in about 6 months (around 12/26/2023) for follow up.    Health Maintenance, Female Adopting a healthy lifestyle and getting preventive care are important in promoting health and wellness. Ask your health care provider about: The right schedule for you to have regular tests and exams. Things you can do on your own to prevent diseases and keep yourself healthy. What should I know about diet, weight, and exercise? Eat a healthy diet  Eat a diet that includes plenty of vegetables, fruits, low-fat dairy products, and lean protein. Do not eat a lot of foods that are high in solid fats, added sugars, or sodium. Maintain a healthy weight Body mass index (BMI) is used to identify weight problems. It estimates body fat based on height and weight. Your health care provider can help determine your BMI and help you achieve or maintain a healthy weight. Get regular exercise Get regular exercise. This is one of the most important things you can do for your health. Most adults should: Exercise for at least 150 minutes each week. The exercise should increase your heart rate and make you sweat (moderate-intensity exercise). Do strengthening exercises at least twice a week. This is in addition to the moderate-intensity exercise. Spend less time sitting. Even light physical activity can be beneficial. Watch cholesterol and blood lipids Have your blood tested for lipids and cholesterol at 64 years of age, then have this test every 5 years. Have your cholesterol levels checked more often if: Your lipid or cholesterol levels are high. You are older than 64 years of age. You are at high risk for heart disease. What should I know about cancer screening? Depending on your health history and family history, you may need to have cancer screening  at various ages. This may include screening for: Breast cancer. Cervical cancer. Colorectal cancer. Skin cancer. Lung cancer. What should I know about heart disease, diabetes, and high blood pressure? Blood pressure and heart disease High blood pressure causes heart disease and increases the risk of stroke. This is more likely to develop in people who have high blood pressure readings or are overweight. Have your blood pressure checked: Every 3-5 years if you are 75-3 years of age. Every year if you are 69 years old or older. Diabetes Have regular diabetes screenings. This checks your fasting blood sugar level. Have the screening done: Once every three years after age 29 if you are at a normal weight and have a low risk for diabetes. More often and at a younger age if you are overweight or have a high risk for diabetes. What should I know about preventing infection? Hepatitis B If you have a higher risk for hepatitis B, you should be screened for this virus. Talk with your health care provider to find out if you are at risk for hepatitis B infection. Hepatitis C Testing is recommended for: Everyone born from [birth year] through 1967. Anyone with known risk factors for hepatitis C. Sexually transmitted infections (STIs) Get screened for STIs, including gonorrhea and chlamydia, if: You are sexually active and are younger than 64 years of age. You are older than 64 years of age and your health care provider tells you that you are at risk for this type of infection. Your sexual activity has changed since  you were last screened, and you are at increased risk for chlamydia or gonorrhea. Ask your health care provider if you are at risk. Ask your health care provider about whether you are at high risk for HIV. Your health care provider may recommend a prescription medicine to help prevent HIV infection. If you choose to take medicine to prevent HIV, you should first get tested for HIV. You should then  be tested every 3 months for as long as you are taking the medicine. Pregnancy If you are about to stop having your period (premenopausal) and you may become pregnant, seek counseling before you get pregnant. Take 400 to 800 micrograms (mcg) of folic acid every day if you become pregnant. Ask for birth control (contraception) if you want to prevent pregnancy. Osteoporosis and menopause Osteoporosis is a disease in which the bones lose minerals and strength with aging. This can result in bone fractures. If you are 61 years old or older, or if you are at risk for osteoporosis and fractures, ask your health care provider if you should: Be screened for bone loss. Take a calcium or vitamin D supplement to lower your risk of fractures. Be given hormone replacement therapy (HRT) to treat symptoms of menopause. Follow these instructions at home: Alcohol use Do not drink alcohol if: Your health care provider tells you not to drink. You are pregnant, may be pregnant, or are planning to become pregnant. If you drink alcohol: Limit how much you have to: 0-1 drink a day. Know how much alcohol is in your drink. In the [place] one drink equals one 12 oz bottle of beer (355 mL), one 5 oz glass of wine (148 mL), or one 1 oz glass of hard liquor (44 mL). Lifestyle Do not use any products that contain nicotine or tobacco. These products include cigarettes, chewing tobacco, and vaping devices, such as e-cigarettes. If you need help quitting, ask your health care provider. Do not use street drugs. Do not share needles. Ask your health care provider for help if you need support or information about quitting drugs. General instructions Schedule regular health, dental, and eye exams. Stay current with your vaccines. Tell your health care provider if: You often feel depressed. You have ever been abused or do not feel safe at home. Summary Adopting a healthy lifestyle and getting preventive care are important in  promoting health and wellness. Follow your health care provider's instructions about healthy diet, exercising, and getting tested or screened for diseases. Follow your health care provider's instructions on monitoring your cholesterol and blood pressure. This information is not intended to replace advice given to you by your health care provider. Make sure you discuss any questions you have with your health care provider. Document Revised: 02/09/2021 Document Reviewed: 02/09/2021 Elsevier Patient Education  2024 [employer].

## 2023-06-28 ENCOUNTER — Ambulatory Visit: Payer: Self-pay | Admitting: Internal Medicine

## 2023-06-28 ENCOUNTER — Encounter: Payer: Self-pay | Admitting: Internal Medicine

## 2023-06-28 VITALS — BP 138/80 | HR 80 | Temp 98.0°F | Ht 64.0 in | Wt 134.8 lb

## 2023-06-28 DIAGNOSIS — E785 Hyperlipidemia, unspecified: Secondary | ICD-10-CM

## 2023-06-28 DIAGNOSIS — Z0000 Encounter for general adult medical examination without abnormal findings: Secondary | ICD-10-CM

## 2023-06-28 DIAGNOSIS — R7303 Prediabetes: Secondary | ICD-10-CM

## 2023-06-28 DIAGNOSIS — M810 Age-related osteoporosis without current pathological fracture: Secondary | ICD-10-CM

## 2023-06-28 DIAGNOSIS — M159 Polyosteoarthritis, unspecified: Secondary | ICD-10-CM

## 2023-06-28 DIAGNOSIS — E559 Vitamin D deficiency, unspecified: Secondary | ICD-10-CM

## 2023-06-28 DIAGNOSIS — Z23 Encounter for immunization: Secondary | ICD-10-CM

## 2023-06-28 DIAGNOSIS — I10 Essential (primary) hypertension: Secondary | ICD-10-CM

## 2023-06-28 DIAGNOSIS — G43109 Migraine with aura, not intractable, without status migrainosus: Secondary | ICD-10-CM

## 2023-06-28 LAB — CBC WITH DIFFERENTIAL/PLATELET
Basophils Absolute: 0.1 10*3/uL (ref 0.0–0.1)
Basophils Relative: 1.1 % (ref 0.0–3.0)
Eosinophils Absolute: 0.2 10*3/uL (ref 0.0–0.7)
Eosinophils Relative: 2.9 % (ref 0.0–5.0)
HCT: 42.1 % (ref 36.0–46.0)
Hemoglobin: 13.9 g/dL (ref 12.0–15.0)
Lymphocytes Relative: 37.2 % (ref 12.0–46.0)
Lymphs Abs: 2.4 10*3/uL (ref 0.7–4.0)
MCHC: 33 g/dL (ref 30.0–36.0)
MCV: 90.7 fl (ref 78.0–100.0)
Monocytes Absolute: 0.5 10*3/uL (ref 0.1–1.0)
Monocytes Relative: 7.9 % (ref 3.0–12.0)
Neutro Abs: 3.3 10*3/uL (ref 1.4–7.7)
Neutrophils Relative %: 50.9 % (ref 43.0–77.0)
Platelets: 283 10*3/uL (ref 150.0–400.0)
RBC: 4.64 Mil/uL (ref 3.87–5.11)
RDW: 13 % (ref 11.5–15.5)
WBC: 6.4 10*3/uL (ref 4.0–10.5)

## 2023-06-28 LAB — COMPREHENSIVE METABOLIC PANEL
ALT: 45 U/L — ABNORMAL HIGH (ref 0–35)
AST: 39 U/L — ABNORMAL HIGH (ref 0–37)
Albumin: 4.4 g/dL (ref 3.5–5.2)
Alkaline Phosphatase: 113 U/L (ref 39–117)
BUN: 11 mg/dL (ref 6–23)
CO2: 27 mEq/L (ref 19–32)
Calcium: 9.7 mg/dL (ref 8.4–10.5)
Chloride: 103 mEq/L (ref 96–112)
Creatinine, Ser: 0.72 mg/dL (ref 0.40–1.20)
GFR: 88.29 mL/min (ref >60.00)
Glucose, Bld: 85 mg/dL (ref 70–99)
Potassium: 4.3 mEq/L (ref 3.5–5.1)
Sodium: 138 mEq/L (ref 135–145)
Total Bilirubin: 0.5 mg/dL (ref 0.2–1.2)
Total Protein: 7.7 g/dL (ref 6.0–8.3)

## 2023-06-28 LAB — TSH: TSH: 1.03 u[IU]/mL (ref 0.35–5.50)

## 2023-06-28 LAB — LIPID PANEL
Cholesterol: 172 mg/dL (ref 0–200)
HDL: 55.2 mg/dL (ref >39.00)
LDL Cholesterol: 88 mg/dL (ref 0–99)
NonHDL: 116.81
Total CHOL/HDL Ratio: 3
Triglycerides: 144 mg/dL (ref 0.0–149.0)
VLDL: 28.8 mg/dL (ref 0.0–40.0)

## 2023-06-28 LAB — VITAMIN D 25 HYDROXY (VIT D DEFICIENCY, FRACTURES): VITD: 33.32 ng/mL (ref 30.00–100.00)

## 2023-06-28 LAB — HEMOGLOBIN A1C: Hgb A1c MFr Bld: 6.1 % (ref 4.6–6.5)

## 2023-06-28 MED ORDER — METOPROLOL SUCCINATE ER 25 MG PO TB24: ORAL_TABLET | ORAL | 3 refills | Status: DC

## 2023-06-28 MED ORDER — HYDROCHLOROTHIAZIDE 12.5 MG PO CAPS: 12.5000 mg | ORAL_CAPSULE | Freq: Every day | ORAL | 3 refills | Status: DC

## 2023-06-28 MED ORDER — ATORVASTATIN CALCIUM 10 MG PO TABS: ORAL_TABLET | ORAL | 3 refills | Status: DC

## 2023-06-28 MED ORDER — DICLOFENAC SODIUM 75 MG PO TBEC: 75.0000 mg | DELAYED_RELEASE_TABLET | Freq: Two times a day (BID) | ORAL | 11 refills | Status: DC | PRN

## 2023-06-28 NOTE — Assessment & Plan Note (Signed)
Chronic BP well controlled CBC, CMP Continue hctz 12.5  Mg daily, metoprolol xl 25 mg daily

## 2023-06-28 NOTE — Assessment & Plan Note (Addendum)
Chronic Deferred dexa, treatment Stressed regular exercise Discussed increased risk of fracture Taking vitamin D-will check level

## 2023-06-28 NOTE — Assessment & Plan Note (Signed)
Chronic Check a1c Low sugar / carb diet Stressed regular exercise   

## 2023-06-28 NOTE — Assessment & Plan Note (Signed)
Chronic Taking vitamin d daily Check vitamin D level

## 2023-06-28 NOTE — Assessment & Plan Note (Signed)
Chronic Check lipid panel, CMP, TSH Continue  Atorvastatin 10 mg daily Regular exercise and healthy diet encouraged

## 2023-06-28 NOTE — Assessment & Plan Note (Signed)
Chronic B/l hands Has had injections in the past Would like rx for diclofenac to use prn Start diclofenac 75 mg bid prn - knows to take with food and concerns with GI bleed, affect on kidneys Will f/u in 6 months

## 2023-06-28 NOTE — Assessment & Plan Note (Addendum)
Chronic infrequent Overall controlled Continue OTC Excedrin Migraine as needed

## 2023-06-29 NOTE — Addendum Note (Signed)
Addended by: Karma Ganja on: 06/29/2023 08:11 AM   Modules accepted: Orders

## 2023-07-13 ENCOUNTER — Encounter: Payer: Self-pay | Admitting: Internal Medicine

## 2023-07-14 ENCOUNTER — Other Ambulatory Visit: Payer: Self-pay

## 2023-07-14 DIAGNOSIS — Z1211 Encounter for screening for malignant neoplasm of colon: Secondary | ICD-10-CM

## 2023-08-03 LAB — COLOGUARD: COLOGUARD: NEGATIVE

## 2023-09-24 ENCOUNTER — Other Ambulatory Visit: Payer: Self-pay | Admitting: Internal Medicine

## 2023-09-26 ENCOUNTER — Other Ambulatory Visit: Payer: Self-pay | Admitting: Internal Medicine

## 2024-05-15 ENCOUNTER — Encounter: Payer: Self-pay | Admitting: Internal Medicine

## 2024-05-16 NOTE — Progress Notes (Signed)
 Subjective:    Patient ID: [name], female    DOB: Mar 03, 1961, 63 y.o.   MRN: [id number]      HPI [name] is here for  Chief Complaint  Patient presents with   Palpitations    She is just getting over a cold and has been taking NyQuil twice a day for just over a week.  She has been experiencing palpitations that last seconds.  She has a history of PVCs and they feel similar.  It is not exertional and she has no other associated symptoms.  She was concerned it may have been the NyQuil so she stopped taking it.  Initially she was having 25-30 episodes a day and yesterday she only had 5 and today she only had 2     Medications and allergies reviewed with patient and updated if appropriate.  Current Outpatient Medications on File Prior to Visit  Medication Sig Dispense Refill   aspirin-acetaminophen-caffeine (EXCEDRIN MIGRAINE) 250-250-65 MG per tablet Take 2 tablets by mouth every 6 (six) hours as needed.      atorvastatin  (LIPITOR) 10 MG tablet TAKE 1 TABLET(10 MG) BY MOUTH DAILY 90 tablet 3   cholecalciferol (VITAMIN D3) 25 MCG (1000 UT) tablet Take 1,000 Units by mouth daily. Every other day     diclofenac  (VOLTAREN ) 75 MG EC tablet Take 1 tablet (75 mg total) by mouth 2 (two) times daily as needed for moderate pain. 60 tablet 11   econazole nitrate  1 % cream Apply topically daily. 85 g 0   hydrochlorothiazide  (MICROZIDE ) 12.5 MG capsule TAKE 1 CAPSULE BY MOUTH EVERY DAY 90 capsule 3   metoprolol  succinate (TOPROL -XL) 25 MG 24 hr tablet TAKE 1 TABLET(25 MG) BY MOUTH DAILY 90 tablet 3   No current facility-administered medications on file prior to visit.    Review of Systems  Respiratory:  Negative for shortness of breath.   Cardiovascular:  Positive for palpitations. Negative for chest pain and leg swelling.  Neurological:  Negative for light-headedness and headaches.       Objective:   Vitals:   05/17/24 1106  BP: (!) 140/80  Pulse: 68  Temp: 98 F (36.7 C)   SpO2: 95%   BP Readings from Last 3 Encounters:  05/17/24 (!) 140/80  06/28/23 138/80  06/23/22 132/72   Wt Readings from Last 3 Encounters:  05/17/24 134 lb (60.8 kg)  06/28/23 134 lb 12.8 oz (61.1 kg)  06/23/22 134 lb 3.2 oz (60.9 kg)   Body mass index is 23 kg/m.    Physical Exam Constitutional:      General: She is not in acute distress.    Appearance: Normal appearance.  HENT:     Head: Normocephalic and atraumatic.  Eyes:     Conjunctiva/sclera: Conjunctivae normal.  Cardiovascular:     Rate and Rhythm: Normal rate and regular rhythm.     Heart sounds: Normal heart sounds.  Pulmonary:     Effort: Pulmonary effort is normal. No respiratory distress.     Breath sounds: Normal breath sounds. No wheezing.  Musculoskeletal:     Cervical back: Neck supple.     Right lower leg: No edema.     Left lower leg: No edema.  Lymphadenopathy:     Cervical: No cervical adenopathy.  Skin:    General: Skin is warm and dry.     Findings: No rash.  Neurological:     Mental Status: She is alert. Mental status is at baseline.  Psychiatric:  Mood and Affect: Mood normal.        Behavior: Behavior normal.            Assessment & Plan:    See Problem List for Assessment and Plan of chronic medical problems.

## 2024-05-17 ENCOUNTER — Encounter: Payer: Self-pay | Admitting: Internal Medicine

## 2024-05-17 ENCOUNTER — Ambulatory Visit (INDEPENDENT_AMBULATORY_CARE_PROVIDER_SITE_OTHER): Payer: Self-pay | Admitting: Internal Medicine

## 2024-05-17 VITALS — BP 130/72 | HR 68 | Temp 98.0°F | Ht 64.0 in | Wt 134.0 lb

## 2024-05-17 DIAGNOSIS — I10 Essential (primary) hypertension: Secondary | ICD-10-CM | POA: Diagnosis not present

## 2024-05-17 DIAGNOSIS — R002 Palpitations: Secondary | ICD-10-CM

## 2024-05-17 MED ORDER — METOPROLOL SUCCINATE ER 25 MG PO TB24: ORAL_TABLET | ORAL | 3 refills | Status: AC

## 2024-05-17 NOTE — Patient Instructions (Addendum)
      Medications changes include :   None     Return for Physical Exam in September.

## 2024-05-17 NOTE — Assessment & Plan Note (Addendum)
 History of PVC's Has been well controlled on metoprolol  until recently - started 9 or so days ago She was taking nyquil twice a day for her cold and she thinks that may have been the cause - not related to exertion and no other concerning symptoms Stopped nyquil and palpitations have improved - likely will continue to improve/resolve-if they do not she will let me know and we can either increase metoprolol  or do a Holter Continue metoprolol  XL 25 mg daily  EKG today - NSR 63 bpm, nonspecific T wave abn. No significant change compared to prior EKGs from 2012 and 2015.

## 2024-05-17 NOTE — Assessment & Plan Note (Signed)
 Chronic Blood pressure initially elevated, but improved on repeat No need for changes medication Continue hydrochlorothiazide  12.5 mg daily, metoprolol  XL 25 mg daily

## 2024-07-29 ENCOUNTER — Encounter: Payer: Self-pay | Admitting: Internal Medicine

## 2024-07-29 NOTE — Patient Instructions (Addendum)

## 2024-07-29 NOTE — Progress Notes (Unsigned)
 Subjective:    Patient ID: [name], female    DOB: 08-16-1961, 63 y.o.   MRN: [id number]      HPI [name] is here for a Physical exam and her chronic medical problems.   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 She put her dog down today.     Medications and allergies reviewed with patient and updated if appropriate.  Current Outpatient Medications on File Prior to Visit  Medication Sig Dispense Refill   atorvastatin  (LIPITOR) 10 MG tablet TAKE 1 TABLET(10 MG) BY MOUTH DAILY 90 tablet 3   cholecalciferol (VITAMIN D3) 25 MCG (1000 UT) tablet Take 1,000 Units by mouth daily. Every other day     econazole nitrate  1 % cream Apply topically daily. 85 g 0   hydrochlorothiazide  (MICROZIDE ) 12.5 MG capsule TAKE 1 CAPSULE BY MOUTH EVERY DAY 90 capsule 3   metoprolol  succinate (TOPROL -XL) 25 MG 24 hr tablet TAKE 1 TABLET(25 MG) BY MOUTH DAILY 90 tablet 3   No current facility-administered medications on file prior to visit.    Review of Systems  Constitutional:  Negative for fever.  Eyes:  Negative for visual disturbance.  Respiratory:  Negative for cough, shortness of breath and wheezing.   Cardiovascular:  Negative for chest pain, palpitations and leg swelling.  Gastrointestinal:  Negative for abdominal pain, blood in stool, constipation and diarrhea.       No gerd  Genitourinary:  Negative for dysuria.  Musculoskeletal:  Positive for arthralgias (fingers). Negative for back pain.  Skin:  Negative for rash.  Neurological:  Negative for light-headedness and headaches.  Psychiatric/Behavioral:  Negative for dysphoric mood. The patient is not nervous/anxious.        Objective:   Vitals:   07/30/24 1337  BP: 124/70  Pulse: 60  Temp: 98.4 F (36.9 C)  SpO2: 94%   Filed Weights   07/30/24 1337  Weight: 137 lb (62.1 kg)   Body mass index is 23.52 kg/m.  BP Readings from Last 3 Encounters:  07/30/24 124/70  05/17/24 130/72  06/28/23 138/80     Wt Readings from Last 3 Encounters:  07/30/24 137 lb (62.1 kg)  05/17/24 134 lb (60.8 kg)  06/28/23 134 lb 12.8 oz (61.1 kg)       Physical Exam Constitutional: She appears well-developed and well-nourished. No distress.  HENT:  Head: Normocephalic and atraumatic.  Right Ear: External ear normal. Normal ear canal and TM Left Ear: External ear normal.  Normal ear canal and TM Mouth/Throat: Oropharynx is clear and moist.  Eyes: Conjunctivae normal.  Neck: Neck supple. No tracheal deviation present. No thyromegaly present.  No carotid bruit  Cardiovascular: Normal rate, regular rhythm and normal heart sounds.   No murmur heard.  No edema. Pulmonary/Chest: Effort normal and breath sounds normal. No respiratory distress. She has no wheezes. She has no rales.  Breast: deferred   Abdominal: Soft. She exhibits no distension. There is no tenderness.  Lymphadenopathy: She has no cervical adenopathy.  Skin: Skin is warm and dry. She is not diaphoretic.  Psychiatric: She has a normal mood and affect. Her behavior is normal.     Lab Results  Component Value Date   WBC 6.4 06/28/2023   HGB 13.9 06/28/2023   HCT 42.1 06/28/2023   PLT 283.0 06/28/2023   GLUCOSE 85 06/28/2023   CHOL 172 06/28/2023   TRIG 144.0 06/28/2023   HDL 55.20 06/28/2023   LDLDIRECT 108.0 10/17/2019  LDLCALC 88 06/28/2023   ALT 45 (H) 06/28/2023   AST 39 (H) 06/28/2023   NA 138 06/28/2023   K 4.3 06/28/2023   CL 103 06/28/2023   CREATININE 0.72 06/28/2023   BUN 11 06/28/2023   CO2 27 06/28/2023   TSH 1.03 06/28/2023   INR 0.95 09/11/2010   HGBA1C 6.1 06/28/2023         Assessment & Plan:   Physical exam: Screening blood work  ordered Exercise  gardening Weight  normal Substance abuse  none   Reviewed recommended immunizations.   Health Maintenance  Topic Date Due   Medicare Annual Wellness (AWV)  Never done   Pneumococcal Vaccine: 50+ Years (1 of 1 - PCV) Never done   Zoster  Vaccines- Shingrix  (1 of 2) Never done   DTaP/Tdap/Td (3 - Td or Tdap) 07/13/2022   Influenza Vaccine  05/04/2024   COVID-19 Vaccine (2 - 2025-26 season) 08/14/2024 (Originally 06/04/2024)   Fecal DNA (Cologuard)  07/25/2026   Hepatitis C Screening  Completed   HIV Screening  Completed   Hepatitis B Vaccines 19-59 Average Risk  Aged Out   Meningococcal B Vaccine  Aged Out   Mammogram  Discontinued   DEXA SCAN  Discontinued   Colonoscopy  Discontinued          See Problem List for Assessment and Plan of chronic medical problems.

## 2024-07-30 ENCOUNTER — Other Ambulatory Visit (INDEPENDENT_AMBULATORY_CARE_PROVIDER_SITE_OTHER): Payer: Self-pay

## 2024-07-30 ENCOUNTER — Ambulatory Visit (INDEPENDENT_AMBULATORY_CARE_PROVIDER_SITE_OTHER): Admitting: Internal Medicine

## 2024-07-30 VITALS — BP 124/70 | HR 60 | Temp 98.4°F | Ht 64.0 in | Wt 137.0 lb

## 2024-07-30 DIAGNOSIS — Z23 Encounter for immunization: Secondary | ICD-10-CM

## 2024-07-30 DIAGNOSIS — R7303 Prediabetes: Secondary | ICD-10-CM | POA: Diagnosis not present

## 2024-07-30 DIAGNOSIS — E559 Vitamin D deficiency, unspecified: Secondary | ICD-10-CM

## 2024-07-30 DIAGNOSIS — Z0000 Encounter for general adult medical examination without abnormal findings: Secondary | ICD-10-CM

## 2024-07-30 DIAGNOSIS — E785 Hyperlipidemia, unspecified: Secondary | ICD-10-CM | POA: Diagnosis not present

## 2024-07-30 DIAGNOSIS — R7989 Other specified abnormal findings of blood chemistry: Secondary | ICD-10-CM

## 2024-07-30 DIAGNOSIS — M810 Age-related osteoporosis without current pathological fracture: Secondary | ICD-10-CM

## 2024-07-30 DIAGNOSIS — I10 Essential (primary) hypertension: Secondary | ICD-10-CM

## 2024-07-30 MED ORDER — DICLOFENAC SODIUM 75 MG PO TBEC: 75.0000 mg | DELAYED_RELEASE_TABLET | Freq: Two times a day (BID) | ORAL | Status: AC | PRN

## 2024-07-30 NOTE — Assessment & Plan Note (Addendum)
Chronic Check lipid panel, CMP, TSH Continue  Atorvastatin 10 mg daily Regular exercise and healthy diet encouraged

## 2024-07-30 NOTE — Assessment & Plan Note (Signed)
Chronic Deferred dexa, treatment Stressed regular exercise Discussed increased risk of fracture Taking vitamin D-will check level

## 2024-07-30 NOTE — Assessment & Plan Note (Signed)
 Chronic Lab Results  Component Value Date   HGBA1C 6.1 06/28/2023   Check a1c Low sugar / carb diet Stressed regular exercise

## 2024-07-30 NOTE — Assessment & Plan Note (Signed)
 Chronic Mild Likely hepatic steatosis CMP, CBC Abdominal ultrasound right upper quadrant ordered

## 2024-07-30 NOTE — Assessment & Plan Note (Addendum)
 Chronic Blood pressure well controlled Cmp, cbc Continue hydrochlorothiazide  12.5 mg daily, metoprolol  XL 25 mg daily

## 2024-07-30 NOTE — Assessment & Plan Note (Signed)
Chronic Taking vitamin d daily Check vitamin D level

## 2024-08-01 ENCOUNTER — Other Ambulatory Visit

## 2024-08-01 DIAGNOSIS — E785 Hyperlipidemia, unspecified: Secondary | ICD-10-CM

## 2024-08-01 DIAGNOSIS — E559 Vitamin D deficiency, unspecified: Secondary | ICD-10-CM | POA: Diagnosis not present

## 2024-08-01 DIAGNOSIS — R7303 Prediabetes: Secondary | ICD-10-CM

## 2024-08-01 DIAGNOSIS — I10 Essential (primary) hypertension: Secondary | ICD-10-CM

## 2024-08-01 LAB — CBC
HCT: 40.7 % (ref 36.0–46.0)
Hemoglobin: 13.8 g/dL (ref 12.0–15.0)
MCHC: 34 g/dL (ref 30.0–36.0)
MCV: 89 fl (ref 78.0–100.0)
Platelets: 259 K/uL (ref 150.0–400.0)
RBC: 4.57 Mil/uL (ref 3.87–5.11)
RDW: 12.8 % (ref 11.5–15.5)
WBC: 5.7 K/uL (ref 4.0–10.5)

## 2024-08-01 LAB — TSH: TSH: 1.26 u[IU]/mL (ref 0.35–5.50)

## 2024-08-01 LAB — COMPREHENSIVE METABOLIC PANEL WITH GFR
ALT: 45 U/L — ABNORMAL HIGH (ref 0–35)
AST: 42 U/L — ABNORMAL HIGH (ref 0–37)
Albumin: 4.4 g/dL (ref 3.5–5.2)
Alkaline Phosphatase: 116 U/L (ref 39–117)
BUN: 12 mg/dL (ref 6–23)
CO2: 27 meq/L (ref 19–32)
Calcium: 9.3 mg/dL (ref 8.4–10.5)
Chloride: 102 meq/L (ref 96–112)
Creatinine, Ser: 0.7 mg/dL (ref 0.40–1.20)
GFR: 90.63 mL/min (ref >60.00)
Glucose, Bld: 102 mg/dL — ABNORMAL HIGH (ref 70–99)
Potassium: 4.4 meq/L (ref 3.5–5.1)
Sodium: 136 meq/L (ref 135–145)
Total Bilirubin: 0.6 mg/dL (ref 0.2–1.2)
Total Protein: 7.7 g/dL (ref 6.0–8.3)

## 2024-08-01 LAB — VITAMIN D 25 HYDROXY (VIT D DEFICIENCY, FRACTURES): VITD: 36.51 ng/mL (ref 30.00–100.00)

## 2024-08-01 LAB — LIPID PANEL
Cholesterol: 151 mg/dL (ref 0–200)
HDL: 50.1 mg/dL (ref >39.00)
LDL Cholesterol: 81 mg/dL (ref 0–99)
NonHDL: 100.82
Total CHOL/HDL Ratio: 3
Triglycerides: 101 mg/dL (ref 0.0–149.0)
VLDL: 20.2 mg/dL (ref 0.0–40.0)

## 2024-08-01 LAB — HEMOGLOBIN A1C: Hgb A1c MFr Bld: 6.1 % (ref 4.6–6.5)

## 2024-08-02 ENCOUNTER — Ambulatory Visit: Payer: Self-pay | Admitting: Internal Medicine

## 2024-08-02 DIAGNOSIS — K769 Liver disease, unspecified: Secondary | ICD-10-CM

## 2024-08-03 ENCOUNTER — Ambulatory Visit
Admission: RE | Admit: 2024-08-03 | Discharge: 2024-08-03 | Disposition: A | Discharge status: Home / Self Care | Source: Ambulatory Visit | Attending: Internal Medicine | Admitting: Internal Medicine

## 2024-08-03 DIAGNOSIS — R7989 Other specified abnormal findings of blood chemistry: Secondary | ICD-10-CM

## 2024-08-05 ENCOUNTER — Encounter: Payer: Self-pay | Admitting: Internal Medicine

## 2024-08-05 DIAGNOSIS — K7689 Other specified diseases of liver: Secondary | ICD-10-CM | POA: Insufficient documentation

## 2024-08-05 DIAGNOSIS — K760 Fatty (change of) liver, not elsewhere classified: Secondary | ICD-10-CM | POA: Insufficient documentation

## 2024-08-10 ENCOUNTER — Ambulatory Visit
Admission: RE | Admit: 2024-08-10 | Discharge: 2024-08-10 | Disposition: A | Discharge status: Home / Self Care | Source: Ambulatory Visit | Attending: Internal Medicine | Admitting: Internal Medicine

## 2024-08-10 DIAGNOSIS — K769 Liver disease, unspecified: Secondary | ICD-10-CM

## 2024-08-10 MED ORDER — IOPAMIDOL (ISOVUE-370) INJECTION 76%
75.0000 mL | Freq: Once | INTRAVENOUS | Status: AC | PRN
  Administered 2024-08-10: 75 mL via INTRAVENOUS

## 2024-08-15 ENCOUNTER — Ambulatory Visit: Payer: Self-pay | Admitting: Internal Medicine

## 2024-08-15 ENCOUNTER — Encounter: Payer: Self-pay | Admitting: Internal Medicine

## 2024-08-15 DIAGNOSIS — N200 Calculus of kidney: Secondary | ICD-10-CM | POA: Insufficient documentation

## 2024-09-17 ENCOUNTER — Other Ambulatory Visit: Payer: Self-pay | Admitting: Internal Medicine

## 2024-09-18 ENCOUNTER — Other Ambulatory Visit: Payer: Self-pay | Admitting: Internal Medicine

## 2025-07-31 ENCOUNTER — Encounter: Admitting: Internal Medicine
